# Patient Record
Sex: Male | Born: 1970 | Race: Black or African American | Hispanic: No | Marital: Single | Smoking: Current every day smoker
Health system: Southern US, Community
[De-identification: ages and names within clinical notes are randomized; demographics above are authoritative.]

## PROBLEM LIST (undated history)

## (undated) DIAGNOSIS — R569 Unspecified convulsions: Secondary | ICD-10-CM

## (undated) DIAGNOSIS — M1990 Unspecified osteoarthritis, unspecified site: Secondary | ICD-10-CM

## (undated) DIAGNOSIS — I10 Essential (primary) hypertension: Secondary | ICD-10-CM

## (undated) DIAGNOSIS — I639 Cerebral infarction, unspecified: Secondary | ICD-10-CM

---

## 2021-03-29 ENCOUNTER — Inpatient Hospital Stay (HOSPITAL_COMMUNITY): Payer: Medicaid Other

## 2021-03-29 ENCOUNTER — Other Ambulatory Visit: Payer: Self-pay

## 2021-03-29 ENCOUNTER — Inpatient Hospital Stay (HOSPITAL_BASED_OUTPATIENT_CLINIC_OR_DEPARTMENT_OTHER)
Admission: EM | Admit: 2021-03-29 | Discharge: 2021-04-02 | DRG: 064 | Disposition: A | Discharge status: Rehabilitation Facility | Payer: Medicaid Other | Attending: Internal Medicine | Admitting: Internal Medicine

## 2021-03-29 ENCOUNTER — Emergency Department (HOSPITAL_BASED_OUTPATIENT_CLINIC_OR_DEPARTMENT_OTHER): Payer: Medicaid Other

## 2021-03-29 ENCOUNTER — Encounter (HOSPITAL_BASED_OUTPATIENT_CLINIC_OR_DEPARTMENT_OTHER): Payer: Self-pay | Admitting: *Deleted

## 2021-03-29 DIAGNOSIS — I618 Other nontraumatic intracerebral hemorrhage: Secondary | ICD-10-CM | POA: Diagnosis present

## 2021-03-29 DIAGNOSIS — G40909 Epilepsy, unspecified, not intractable, without status epilepticus: Secondary | ICD-10-CM | POA: Diagnosis present

## 2021-03-29 DIAGNOSIS — I161 Hypertensive emergency: Secondary | ICD-10-CM

## 2021-03-29 DIAGNOSIS — I610 Nontraumatic intracerebral hemorrhage in hemisphere, subcortical: Secondary | ICD-10-CM

## 2021-03-29 DIAGNOSIS — R200 Anesthesia of skin: Secondary | ICD-10-CM | POA: Diagnosis present

## 2021-03-29 DIAGNOSIS — R7401 Elevation of levels of liver transaminase levels: Secondary | ICD-10-CM | POA: Diagnosis present

## 2021-03-29 DIAGNOSIS — I639 Cerebral infarction, unspecified: Secondary | ICD-10-CM | POA: Diagnosis present

## 2021-03-29 DIAGNOSIS — F17200 Nicotine dependence, unspecified, uncomplicated: Secondary | ICD-10-CM

## 2021-03-29 DIAGNOSIS — Z8673 Personal history of transient ischemic attack (TIA), and cerebral infarction without residual deficits: Secondary | ICD-10-CM

## 2021-03-29 DIAGNOSIS — F10239 Alcohol dependence with withdrawal, unspecified: Secondary | ICD-10-CM | POA: Diagnosis present

## 2021-03-29 DIAGNOSIS — I10 Essential (primary) hypertension: Secondary | ICD-10-CM | POA: Diagnosis present

## 2021-03-29 DIAGNOSIS — F17210 Nicotine dependence, cigarettes, uncomplicated: Secondary | ICD-10-CM | POA: Diagnosis present

## 2021-03-29 DIAGNOSIS — E785 Hyperlipidemia, unspecified: Secondary | ICD-10-CM | POA: Diagnosis present

## 2021-03-29 DIAGNOSIS — I69354 Hemiplegia and hemiparesis following cerebral infarction affecting left non-dominant side: Secondary | ICD-10-CM

## 2021-03-29 DIAGNOSIS — Z888 Allergy status to other drugs, medicaments and biological substances status: Secondary | ICD-10-CM

## 2021-03-29 DIAGNOSIS — F1010 Alcohol abuse, uncomplicated: Secondary | ICD-10-CM

## 2021-03-29 DIAGNOSIS — Z20822 Contact with and (suspected) exposure to covid-19: Secondary | ICD-10-CM | POA: Diagnosis present

## 2021-03-29 DIAGNOSIS — E119 Type 2 diabetes mellitus without complications: Secondary | ICD-10-CM | POA: Diagnosis present

## 2021-03-29 DIAGNOSIS — Z9114 Patient's other noncompliance with medication regimen: Secondary | ICD-10-CM | POA: Diagnosis not present

## 2021-03-29 DIAGNOSIS — E876 Hypokalemia: Secondary | ICD-10-CM | POA: Diagnosis present

## 2021-03-29 DIAGNOSIS — Z91013 Allergy to seafood: Secondary | ICD-10-CM

## 2021-03-29 DIAGNOSIS — G936 Cerebral edema: Secondary | ICD-10-CM | POA: Diagnosis present

## 2021-03-29 DIAGNOSIS — I6389 Other cerebral infarction: Secondary | ICD-10-CM

## 2021-03-29 DIAGNOSIS — Z9119 Patient's noncompliance with other medical treatment and regimen: Secondary | ICD-10-CM | POA: Diagnosis not present

## 2021-03-29 DIAGNOSIS — Z91199 Patient's noncompliance with other medical treatment and regimen due to unspecified reason: Secondary | ICD-10-CM

## 2021-03-29 HISTORY — DX: Unspecified convulsions: R56.9

## 2021-03-29 HISTORY — DX: Essential (primary) hypertension: I10

## 2021-03-29 HISTORY — DX: Cerebral infarction, unspecified: I63.9

## 2021-03-29 HISTORY — DX: Unspecified osteoarthritis, unspecified site: M19.90

## 2021-03-29 LAB — CBC
HCT: 41.7 % (ref 39.0–52.0)
HCT: 43 % (ref 39.0–52.0)
Hemoglobin: 14.2 g/dL (ref 13.0–17.0)
Hemoglobin: 14.7 g/dL (ref 13.0–17.0)
MCH: 28.9 pg (ref 26.0–34.0)
MCH: 29.1 pg (ref 26.0–34.0)
MCHC: 34.1 g/dL (ref 30.0–36.0)
MCHC: 34.2 g/dL (ref 30.0–36.0)
MCV: 84.9 fL (ref 80.0–100.0)
MCV: 85.1 fL (ref 80.0–100.0)
Platelets: 204 10*3/uL (ref 150–400)
Platelets: 212 10*3/uL (ref 150–400)
RBC: 4.91 MIL/uL (ref 4.22–5.81)
RBC: 5.05 MIL/uL (ref 4.22–5.81)
RDW: 13.6 % (ref 11.5–15.5)
RDW: 13.8 % (ref 11.5–15.5)
WBC: 5 10*3/uL (ref 4.0–10.5)
WBC: 5.8 10*3/uL (ref 4.0–10.5)
nRBC: 0 % (ref 0.0–0.2)
nRBC: 0 % (ref 0.0–0.2)

## 2021-03-29 LAB — URINALYSIS, ROUTINE W REFLEX MICROSCOPIC
Bilirubin Urine: NEGATIVE
Glucose, UA: NEGATIVE mg/dL
Hgb urine dipstick: NEGATIVE
Ketones, ur: NEGATIVE mg/dL
Leukocytes,Ua: NEGATIVE
Nitrite: NEGATIVE
Protein, ur: NEGATIVE mg/dL
Specific Gravity, Urine: 1.01 (ref 1.005–1.030)
pH: 7 (ref 5.0–8.0)

## 2021-03-29 LAB — COMPREHENSIVE METABOLIC PANEL
ALT: 44 U/L (ref 0–44)
ALT: 43 U/L (ref 0–44)
AST: 57 U/L — ABNORMAL HIGH (ref 15–41)
AST: 43 U/L — ABNORMAL HIGH (ref 15–41)
Albumin: 4.5 g/dL (ref 3.5–5.0)
Albumin: 4.2 g/dL (ref 3.5–5.0)
Alkaline Phosphatase: 60 U/L (ref 38–126)
Alkaline Phosphatase: 60 U/L (ref 38–126)
Anion gap: 12 (ref 5–15)
Anion gap: 15 (ref 5–15)
BUN: 13 mg/dL (ref 6–20)
BUN: 9 mg/dL (ref 6–20)
CO2: 28 mmol/L (ref 22–32)
CO2: 24 mmol/L (ref 22–32)
Calcium: 9 mg/dL (ref 8.9–10.3)
Calcium: 9.3 mg/dL (ref 8.9–10.3)
Chloride: 98 mmol/L (ref 98–111)
Chloride: 98 mmol/L (ref 98–111)
Creatinine, Ser: 1.09 mg/dL (ref 0.61–1.24)
Creatinine, Ser: 0.89 mg/dL (ref 0.61–1.24)
GFR, Estimated: >60 mL/min (ref >60)
GFR, Estimated: >60 mL/min (ref >60)
Glucose, Bld: 97 mg/dL (ref 70–99)
Glucose, Bld: 84 mg/dL (ref 70–99)
Potassium: 3.5 mmol/L (ref 3.5–5.1)
Potassium: 3.5 mmol/L (ref 3.5–5.1)
Sodium: 138 mmol/L (ref 135–145)
Sodium: 137 mmol/L (ref 135–145)
Total Bilirubin: 1.2 mg/dL (ref 0.3–1.2)
Total Bilirubin: 1.4 mg/dL — ABNORMAL HIGH (ref 0.3–1.2)
Total Protein: 7.6 g/dL (ref 6.5–8.1)
Total Protein: 7.1 g/dL (ref 6.5–8.1)

## 2021-03-29 LAB — ECHOCARDIOGRAM COMPLETE
AR max vel: 2.21 cm2
AV Area VTI: 1.96 cm2
AV Area mean vel: 2.1 cm2
AV Mean grad: 5 mmHg
AV Peak grad: 7 mmHg
Ao pk vel: 1.32 m/s
Area-P 1/2: 3.57 cm2
Height: 69 in
S' Lateral: 2 cm
Weight: 2320 oz

## 2021-03-29 LAB — RAPID URINE DRUG SCREEN, HOSP PERFORMED
Amphetamines: NOT DETECTED
Barbiturates: NOT DETECTED
Benzodiazepines: NOT DETECTED
Cocaine: NOT DETECTED
Opiates: NOT DETECTED
Tetrahydrocannabinol: NOT DETECTED

## 2021-03-29 LAB — DIFFERENTIAL
Abs Immature Granulocytes: 0 10*3/uL (ref 0.00–0.07)
Basophils Absolute: 0.1 10*3/uL (ref 0.0–0.1)
Basophils Relative: 1 %
Eosinophils Absolute: 0.1 10*3/uL (ref 0.0–0.5)
Eosinophils Relative: 1 %
Immature Granulocytes: 0 %
Lymphocytes Relative: 63 %
Lymphs Abs: 3.1 10*3/uL (ref 0.7–4.0)
Monocytes Absolute: 0.5 10*3/uL (ref 0.1–1.0)
Monocytes Relative: 10 %
Neutro Abs: 1.3 10*3/uL — ABNORMAL LOW (ref 1.7–7.7)
Neutrophils Relative %: 25 %

## 2021-03-29 LAB — PHOSPHORUS: Phosphorus: 2.6 mg/dL (ref 2.5–4.6)

## 2021-03-29 LAB — RESP PANEL BY RT-PCR (FLU A&B, COVID) ARPGX2
Influenza A by PCR: NEGATIVE
Influenza B by PCR: NEGATIVE
SARS Coronavirus 2 by RT PCR: NEGATIVE

## 2021-03-29 LAB — LIPID PANEL
Cholesterol: 248 mg/dL — ABNORMAL HIGH (ref 0–200)
HDL: 118 mg/dL (ref >40)
LDL Cholesterol: 77 mg/dL (ref 0–99)
Total CHOL/HDL Ratio: 2.1 RATIO
Triglycerides: 266 mg/dL — ABNORMAL HIGH (ref <150)
VLDL: 53 mg/dL — ABNORMAL HIGH (ref 0–40)

## 2021-03-29 LAB — MAGNESIUM: Magnesium: 1.9 mg/dL (ref 1.7–2.4)

## 2021-03-29 LAB — SURGICAL PCR SCREEN
MRSA, PCR: NEGATIVE
Staphylococcus aureus: NEGATIVE

## 2021-03-29 LAB — ETHANOL: Alcohol, Ethyl (B): <10 mg/dL (ref <10)

## 2021-03-29 LAB — PROTIME-INR
INR: 1 (ref 0.8–1.2)
Prothrombin Time: 12.8 seconds (ref 11.4–15.2)

## 2021-03-29 LAB — APTT: aPTT: 30 seconds (ref 24–36)

## 2021-03-29 LAB — HIV ANTIBODY (ROUTINE TESTING W REFLEX): HIV Screen 4th Generation wRfx: NONREACTIVE

## 2021-03-29 MED ORDER — PANTOPRAZOLE SODIUM 40 MG PO TBEC
40.0000 mg | DELAYED_RELEASE_TABLET | Freq: Every day | ORAL | Status: DC
  Administered 2021-03-29 – 2021-04-02 (×5): 40 mg via ORAL
  Filled 2021-03-29 (×5): qty 1

## 2021-03-29 MED ORDER — THIAMINE HCL 100 MG PO TABS
100.0000 mg | ORAL_TABLET | Freq: Every day | ORAL | Status: DC
  Administered 2021-03-29 – 2021-04-02 (×5): 100 mg via ORAL
  Filled 2021-03-29 (×5): qty 1

## 2021-03-29 MED ORDER — THIAMINE HCL 100 MG/ML IJ SOLN
100.0000 mg | Freq: Every day | INTRAMUSCULAR | Status: DC
  Filled 2021-03-29: qty 2

## 2021-03-29 MED ORDER — LORAZEPAM 1 MG PO TABS
0.0000 mg | ORAL_TABLET | Freq: Three times a day (TID) | ORAL | Status: DC
  Administered 2021-03-31: 1 mg via ORAL
  Filled 2021-03-29 (×2): qty 1

## 2021-03-29 MED ORDER — ACETAMINOPHEN 160 MG/5ML PO SOLN: 650.0000 mg | ORAL | Status: DC | PRN

## 2021-03-29 MED ORDER — CHLORHEXIDINE GLUCONATE CLOTH 2 % EX PADS
6.0000 | MEDICATED_PAD | Freq: Every day | CUTANEOUS | Status: DC
  Administered 2021-03-29 – 2021-04-02 (×5): 6 via TOPICAL

## 2021-03-29 MED ORDER — SENNOSIDES-DOCUSATE SODIUM 8.6-50 MG PO TABS
1.0000 | ORAL_TABLET | Freq: Two times a day (BID) | ORAL | Status: DC
  Administered 2021-03-29 – 2021-04-02 (×9): 1 via ORAL
  Filled 2021-03-29 (×9): qty 1

## 2021-03-29 MED ORDER — LORAZEPAM 1 MG PO TABS
0.0000 mg | ORAL_TABLET | ORAL | Status: DC
  Administered 2021-03-30 (×3): 1 mg via ORAL
  Filled 2021-03-29 (×3): qty 1

## 2021-03-29 MED ORDER — FOLIC ACID 1 MG PO TABS
1.0000 mg | ORAL_TABLET | Freq: Every day | ORAL | Status: DC
  Administered 2021-03-29 – 2021-04-02 (×5): 1 mg via ORAL
  Filled 2021-03-29 (×5): qty 1

## 2021-03-29 MED ORDER — LORAZEPAM 1 MG PO TABS: 1.0000 mg | ORAL_TABLET | ORAL | Status: DC | PRN

## 2021-03-29 MED ORDER — LORAZEPAM 2 MG/ML IJ SOLN: 1.0000 mg | INTRAMUSCULAR | Status: DC | PRN

## 2021-03-29 MED ORDER — LABETALOL HCL 200 MG PO TABS
200.0000 mg | ORAL_TABLET | Freq: Two times a day (BID) | ORAL | Status: DC
  Administered 2021-03-29 – 2021-04-02 (×9): 200 mg via ORAL
  Filled 2021-03-29: qty 1
  Filled 2021-03-29: qty 2
  Filled 2021-03-29 (×7): qty 1

## 2021-03-29 MED ORDER — CLEVIDIPINE BUTYRATE 0.5 MG/ML IV EMUL
0.0000 mg/h | INTRAVENOUS | Status: DC
  Administered 2021-03-29: 14 mg/h via INTRAVENOUS
  Administered 2021-03-29: 1 mg/h via INTRAVENOUS
  Administered 2021-03-29: 10 mg/h via INTRAVENOUS
  Administered 2021-03-29: 8 mg/h via INTRAVENOUS
  Filled 2021-03-29 (×4): qty 100

## 2021-03-29 MED ORDER — GADOBUTROL 1 MMOL/ML IV SOLN
6.5000 mL | Freq: Once | INTRAVENOUS | Status: AC | PRN
  Administered 2021-03-29: 6.5 mL via INTRAVENOUS

## 2021-03-29 MED ORDER — ACETAMINOPHEN 325 MG PO TABS: 650.0000 mg | ORAL_TABLET | ORAL | Status: DC | PRN

## 2021-03-29 MED ORDER — LISINOPRIL 20 MG PO TABS: 20.0000 mg | ORAL_TABLET | Freq: Every day | ORAL | Status: DC

## 2021-03-29 MED ORDER — STROKE: EARLY STAGES OF RECOVERY BOOK
Freq: Once | Status: AC
  Filled 2021-03-29: qty 1

## 2021-03-29 MED ORDER — LABETALOL HCL 5 MG/ML IV SOLN: 5.0000 mg | INTRAVENOUS | Status: DC | PRN

## 2021-03-29 MED ORDER — PANTOPRAZOLE SODIUM 40 MG IV SOLR: 40.0000 mg | Freq: Every day | INTRAVENOUS | Status: DC

## 2021-03-29 MED ORDER — AMLODIPINE BESYLATE 10 MG PO TABS
10.0000 mg | ORAL_TABLET | Freq: Every day | ORAL | Status: DC
  Administered 2021-03-29 – 2021-04-02 (×5): 10 mg via ORAL
  Filled 2021-03-29 (×5): qty 1

## 2021-03-29 MED ORDER — ADULT MULTIVITAMIN W/MINERALS CH
1.0000 | ORAL_TABLET | Freq: Every day | ORAL | Status: DC
  Administered 2021-03-29 – 2021-04-02 (×5): 1 via ORAL
  Filled 2021-03-29 (×5): qty 1

## 2021-03-29 MED ORDER — ACETAMINOPHEN 650 MG RE SUPP: 650.0000 mg | RECTAL | Status: DC | PRN

## 2021-03-29 MED ORDER — IOHEXOL 350 MG/ML SOLN
100.0000 mL | Freq: Once | INTRAVENOUS | Status: AC | PRN
  Administered 2021-03-29: 100 mL via INTRAVENOUS

## 2021-03-29 NOTE — Progress Notes (Signed)
PT Cancellation Note  Patient Details Name: Ricky Gonzalez MRN: [id number] DOB: [birth year]   Cancelled Treatment:    Reason Eval/Treat Not Completed: Patient not medically ready Pt considered on active bedrest for 24 hours due to Acute intraparenchymal hemorrhage centered at the left thalamus/internal capsule measures 2.1 x 1.5 x 2.8 cm (estimated volume 4.5 cc) per therapeutic  guidelines protocol / stroke services.  Lillia Pauls, PT, DPT Acute Rehabilitation Services Pager [phone] Office [phone]    Norval Morton 03/29/2021, 8:11 AM

## 2021-03-29 NOTE — ED Notes (Signed)
Patient transported to CT with RN 

## 2021-03-29 NOTE — Evaluation (Signed)
Speech Language Pathology Evaluation Patient Details Name: Ricky Gonzalez MRN: [id number] DOB: [birth year] Today's Date: 03/29/2021 Time: 2992-4268 SLP Time Calculation (min) (ACUTE ONLY): 25 min  Problem List:  Patient Active Problem List   Diagnosis Date Noted   Stroke (cerebrum) (HCC) 03/29/2021   Primary hypertension    Thalamic hemorrhage (HCC)    Hypertensive emergency    Smoker    Alcohol abuse    History of stroke    Past Medical History:  Past Medical History:  Diagnosis Date   Arthritis    Hypertension    Seizures (HCC)    Stroke [facility])    Past Surgical History:  Past Surgical History:  Procedure Laterality Date   CERVICAL SPINE SURGERY     HPI:  50 y.o. male admitted with right-sided numbness.  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 Pt lives with wife and two adult sons.  Since stroke PTA did yard work, drove short distances, helps with household chores.   Assessment / Plan / Recommendation Clinical Impression  Pt presents with normal expressive language and comprehension. He follows multistep commands.  He conveys ideas with no difficulty with word retrieval and good syntax. Speech is clear, without dysarthria, and fluent. Pt is good historian - he demonstrates good awareness, attention, and short term recall. Verbal problem solving is [facility]. There are no needs identified - SLP will sign off. No f/u needed.    SLP Assessment  SLP Recommendation/Assessment: Patient does not need any further Speech Lanaguage Pathology Services SLP Visit Diagnosis: Cognitive communication deficit (R41.841)    Follow Up Recommendations  None    Frequency and Duration   N/a        SLP Evaluation Cognition  Overall Cognitive Status: Within Functional Limits for tasks assessed Arousal/Alertness:  Awake/alert Orientation Level: Oriented X4 Attention: Selective Selective Attention: Appears intact Memory: Appears intact Awareness: Appears intact Problem Solving: Appears intact Executive Function: Reasoning Reasoning: Appears intact Safety/Judgment: Appears intact       Comprehension  Auditory Comprehension Overall Auditory Comprehension: Appears within functional limits for tasks assessed Yes/No Questions: Within Functional Limits Commands: Within Functional Limits Visual Recognition/Discrimination Discrimination: Within Function Limits Reading Comprehension Reading Status: Within funtional limits    Expression Expression Primary Mode of Expression: Verbal Verbal Expression Overall Verbal Expression: Appears within functional limits for tasks assessed   Oral / Motor  Oral Motor/Sensory Function Overall Oral Motor/Sensory Function: Within functional limits Motor Speech Overall Motor Speech: Appears within functional limits for tasks assessed   GO                    Blenda Mounts Laurice 03/29/2021, 2:52 PM  Alexanderia Gorby L. Samson Frederic, MA CCC/SLP Acute Rehabilitation Services Office number [phone] Pager [phone]

## 2021-03-29 NOTE — ED Notes (Signed)
Patient alert and answering questions appropriately.  Unable to feel anything on the right side of his body.  Wife remains at the bedside.  Encouraged to call for assistance as needed.

## 2021-03-29 NOTE — Progress Notes (Signed)
OT Cancellation Note  Patient Details Name: Ricky Gonzalez MRN: [id number] DOB: [birth year]   Cancelled Treatment:    Reason Eval/Treat Not Completed: Patient not medically ready Pt considered on active bedrest due to Acute intraparenchymal hemorrhage centered at the left thalamus/internal capsule measures 2.1 x 1.5 x 2.8 cm (estimated volume 4.5 cc) per therapeutic  guidelines protocol / stroke services.   Wynona Neat, OTR/L  Acute Rehabilitation Services Pager: [phone] Office: [phone] .  03/29/2021, 8:00 AM

## 2021-03-29 NOTE — ED Notes (Signed)
[facility] at bedside to transfer patient to [facility]. No acute distress noted. Wife at bedside.

## 2021-03-29 NOTE — H&P (Signed)
Neurology H&P  CC: Right-sided numbness  History is obtained from: Patient  HPI: Ricky Gonzalez is a 50 y.o. male with a history of hypertension, stroke with left-sided weakness, seizures who presents with right-sided numbness that started yesterday at 4 PM.  He has a chronic left hemiparesis from previous stroke, but the right-sided numbness was new.  Due to this symptom, he sought care at [facility] where a CT was performed showing a intraparenchymal hematoma.  He was hypertensive and started on clevidipine, and transferred to [facility] for further care.   LKW: 4 PM tpa given?: No, ICH IR Thrombectomy? No, ICH Modified Rankin Scale: 2-Slight disability-UNABLE to perform all activities but does not need assistance    ROS: A complete ROS was performed and is negative except as noted in the HPI.  Past Medical History:  Diagnosis Date   Arthritis    Hypertension    Seizures (HCC)    Stroke (HCC)      Family history: No history of stroke   Social History:   Prior to Admission medications   Not on File     Exam: Current vital signs: BP 130/87   Pulse 86   Temp 98.9 F (37.2 C) (Oral)   Resp 17   Ht 5\' 9"  (1.753 m)   Wt 65.8 kg   SpO2 96%   BMI 21.41 kg/m    Physical Exam  Constitutional: Appears well-developed and well-nourished.  Psych: Affect appropriate to situation Eyes: No scleral injection HENT: No OP obstrucion Head: Normocephalic.  Cardiovascular: Normal rate and regular rhythm.  Respiratory: Effort normal and breath sounds normal to anterior ascultation GI: Soft.  No distension. There is no tenderness.  Skin: WDI  Neuro: Mental Status: Patient is awake, alert, oriented to person, place, month, year, and situation. Patient is able to give a clear and coherent history. No signs of aphasia or neglect Cranial Nerves: II: Visual Fields are full.  Right pupil is about 2 mm larger than the left, but both are reactive III,IV, VI: EOMI without  ptosis or diplopia.  V: Facial sensation is symmetric to temperature VII: Facial movement is symmetric.  VIII: hearing is intact to voice X: Uvula is midline and palate elevates symmetrically XI: Shoulder shrug is symmetric. XII: tongue is midline without atrophy or fasciculations.  Motor: Tone is normal. Bulk is normal. 5/5 strength was present on the right, he has 4+/5 strength of the left arm and leg with decreased fine motor out of proportion. Sensory: Sensation is diminished on the right Cerebellar: FNF consistent with weakness on the left, intact on the right   I have reviewed labs in epic and the pertinent results are: CMP-unremarkable other than mildly elevated AST which I suspect is due to EtOH INR 1.0 PTT-normal CBC-normal  I have reviewed the images obtained: CT/CTA-likely hypertensive subcortical bleed on the left with some surrounding cerebral edema  Primary Diagnosis:  Nontraumatic intracerebral hemorrhage in hemisphere, subcortical  Secondary Diagnosis: Cerebral edema, Essential (primary) hypertension, and Hypertension Emergency (SBP > 180 or DBP > 120 & end organ damage)   Impression: 50 year old male with likely hypertensive hemorrhage.  He also endorses a history of significant drinking (a pint a day) and therefore I will start him on CIWA.  Plan: 1) Admit to ICU 2) no antiplatelets or anticoagulants 3) blood pressure control with goal systolic 120 - 140 4) Frequent neuro checks 5) If symptoms worsen or there is decreased mental status, repeat stat head CT 6)  PT,OT,ST    This patient is critically ill and at significant risk of neurological worsening, death and care requires constant monitoring of vital signs, hemodynamics,respiratory and cardiac monitoring, neurological assessment, discussion with family, other specialists and medical decision making of high complexity. I spent 60 minutes of neurocritical care time  in the care of  this patient. This was  time spent independent of any time provided by nurse practitioner or PA.  Ritta Slot, MD [facility] [phone]  If 7pm- 7am, please page neurology on call as listed in AMION.

## 2021-03-29 NOTE — ED Notes (Signed)
Report given to [employer] at [facility].

## 2021-03-29 NOTE — ED Triage Notes (Signed)
Pt brought in by family. Reports right leg numb and weak since 4pm. Pt assisted from car by ED staff and taken to room 5. Dr. Nicanor Alcon in to evaluate pt upon arrival

## 2021-03-29 NOTE — Progress Notes (Addendum)
STROKE TEAM PROGRESS NOTE   Interval History   No acute events overnight, patient is resting in bed with no family at bedside.   Amlodipine 10 mg was initiated for blood pressure control. Patient states that he was placed on Lisinopril on an outpatient basis however developed angioedema due to this.  Will try to wean Clevidipine gtt today.  Pertinent Lab Work and Imaging    03/29/21 CT Head WO IV Contrast 4.5 mL acute intraparenchymal left thalamocapsular intraparenchymal hemorrhage. Mild surrounding edema without significant regional mass effect. No intraventricular extension. A hypertensive etiology is suspected.  03/29/21 CT Angio Head and Neck W WO IV Contrast Negative CTA of the head and neck. No large vessel occlusion. No high-grade or correctable stenosis identified. No aneurysm or vascular abnormality seen underlying the left cerebral hemorrhage.  03/29/21 MRI Brain W WO IV Contrast Results pending   03/29/21 Echocardiogram Complete  Results pending   Physical Examination   Constitutional: Calm, appropriate for condition  Cardiovascular: Normal RR Respiratory: No increased WOB   Mental status: AAOx4 Speech: Fluent with intact naming and repetition  Cranial nerves: EOMI, VFF, Face symmetric, Tongue midline  Motor: Normal bulk and tone. Pronator drift to RUE.   Dlt Bic Tri FgS Grp HF  KnF KnE PIF DoF  R 5 5 5 5 5 5 5 5 5 5   L 5 5 5 5 4 5 5 5 5 5   Sensory: Does not feel light touch in the right arm and right leg  Coordination: FNF + HTS intact  Gait: Deferred given right sided sensory deficit   Assessment and Plan   Ricky Gonzalez is a 50 y.o. 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 He did not receive IVTPA given bleed and was not a candidate for mechanical thrombectomy given lack of LVO.   NEURO #L Thalamic IPH  Patient presented with the symptoms described above,  found to have a L Thalamic IPH. Stroke work up is ongoing at this time. CTA Head and Neck was negative for vascular anomalies or aneurysm that could have caused his bleed. UDS negative. MRI Brain W WO contrast pending to further evaluate for vascular abnormalities. Echo also pending. Stroke labs with LDL 53 and A1C pending. Suspect bleed is in the setting of HTN given location + initial SBP trends in the 160-200 range.  - No antiplatelets  - SBP goal < 140  - Follow up remaining work up- echo and MRI Brain  -At discharge will place ambulatory referral to neurology for stroke follow up   #Substance Abuse  He smokes and drinks a pint of beer everyday at home. Smoking and drinking cessation education provided. CIWA protocol in place for alcohol withdrawal.  - Continue CIWA   CARDS #Hypertension He has a history of HTN and states that on an outpatient basis he was prescribed Lisinopril but stopped taking it as it caused angioedema. Does not appear to have been taking any blood pressure medications at home. Currently he is on a Clevidipine gtt and SBP is trending normotensive.  - Amlodipine 10 mg QD started 03/29/21 - Wean Clevidipine gtt  - SBP goal < 140   #Hyperlipidemia From a stroke prevention stand point, the LDL goal is < 70. LDL is at goal at 53. No need for statin therapy.   RESP Oxygenating well on RA. No acute respiratory issues at this time  RENAL Creatinine and lytes  are WNL  - Monitor lytes with BMP supplement as necessary   GI  #Stroke Dysphagia Screening  Passed bedside swallow evaluation for a regular diet   ENDO #DM II  Hemoglobin A1C pending   HEME Hemoglobin hematocrit and platelet count stable   ID Afebrile. No infectious processes at this time.   Hospital day # 0  Stark Jock, NP  [facility] Nurse Practitioner Patient seen and discussed with attending physician Dr. Roda Shutters    ATTENDING NOTE: I reviewed above note and agree with the  assessment and plan. Pt was seen and examined.   50 year old male with history of hypertension, seizure disorder, smoker, alcohol abuse, stroke in 2017 with residual left-sided mild weakness admitted for right-sided numbness.  CT showed left thalamic/CR ICH.  CTA head and neck no AVM or aneurysm.  MRI with and without contrast no underlying malignancy and hematoma stable.  2D echo pending, UDS negative.  LDL 77, A1c pending.  Creatinine 1.09  On exam, RN at bedside, patient awake, alert, eyes open, orientated to age, place, time. No aphasia, fluent language, following all simple commands. Able to name and repeat. No gaze palsy, tracking bilaterally, visual field full, PERRL. No facial droop. Tongue midline. BUEs 5/5 and BLEs 5/5, no drift. Sensation loss at right face, arm and leg. B/l FTN and HTS dysmetria but right more prominent than left. Gait not tested.   Etiology for patient ICH likely due to hypertensive emergency, in the setting of long-term smoking and alcohol abuse.  Smoking cessation and alcohol restriction education provided.  On CIWA protocol.  MRI showed stable hematoma, will relax BP goal to less than 160.  Add p.o. meds including amlodipine and labetalol.  Patient has allergy to lisinopril in the past.  Taper off Cleviprex as able.  PT/OT pending.  For detailed assessment and plan, please refer to above as I have made changes wherever appropriate.   Marvel Plan, MD PhD Stroke Neurology 03/29/2021 12:44 PM  This patient is critically ill due to ICH, hypertensive emergency, history of stroke and at significant risk of neurological worsening, death form hematoma expansion, cerebral edema, brain herniation, hypertensive encephalopathy, seizure. This patient's care requires constant monitoring of vital signs, hemodynamics, respiratory and cardiac monitoring, review of multiple databases, neurological assessment, discussion with family, other specialists and medical decision making of high  complexity. I spent 35 minutes of neurocritical care time in the care of this patient.    To contact Stroke Continuity provider, please refer to [email or web address]. After hours, contact General Neurology

## 2021-03-29 NOTE — ED Provider Notes (Signed)
[facility] EMERGENCY DEPARTMENT Provider Note   CSN: [id number] Arrival date & time: 03/29/21  0020     History Chief Complaint  Patient presents with   Numbness    Ricky Gonzalez is a 50 y.o. male.   Cerebrovascular Accident This is a new problem. The current episode started 6 to 12 hours ago (8.5). The problem occurs constantly. The problem has not changed since onset.Pertinent negatives include no chest pain and no abdominal pain. Associated symptoms comments: Heard a pop at 4 pm . Nothing aggravates the symptoms. Nothing relieves the symptoms. He has tried nothing for the symptoms. The treatment provided no relief.  Patient with HYN who heard a pop at 4 pm and had right sided numbness and weakness and then went to sleep and symptoms persisted and patient came in.    Past Medical History:  Diagnosis Date   Arthritis    Hypertension    Seizures (HCC)    Stroke (HCC)     There are no problems to display for this patient.   Past Surgical History:  Procedure Laterality Date   CERVICAL SPINE SURGERY         History reviewed. No pertinent family history.  Social History   Tobacco Use   Smoking status: Every Day    Pack years: 0.00    Types: Cigarettes   Smokeless tobacco: Never  Vaping Use   Vaping Use: Never used  Substance Use Topics   Alcohol use: Yes    Comment: 2/day   Drug use: Never    Home Medications Prior to Admission medications   Not on File    Allergies    Fish allergy  Review of Systems   Review of Systems  Constitutional:  Negative for fever.  HENT:  Negative for facial swelling.   Eyes:  Negative for redness.  Cardiovascular:  Negative for chest pain.  Gastrointestinal:  Negative for abdominal pain.  Genitourinary:  Negative for difficulty urinating.  Musculoskeletal:  Negative for neck stiffness.  Skin:  Negative for rash.  Neurological:  Positive for weakness and numbness. Negative for facial asymmetry.   Psychiatric/Behavioral:  Negative for agitation.   All other systems reviewed and are negative.  Physical Exam Updated Vital Signs BP (!) 166/115   Pulse 76   Temp 98.9 F (37.2 C) (Oral)   Resp 16   Ht 5\' 9"  (1.753 m)   Wt 65.8 kg   SpO2 99%   BMI 21.41 kg/m   Physical Exam Vitals and nursing note reviewed.  Constitutional:      General: He is not in acute distress.    Appearance: Normal appearance.  HENT:     Head: Normocephalic and atraumatic.     Nose: Nose normal.     Mouth/Throat:     Mouth: Mucous membranes are moist.  Eyes:     Extraocular Movements: Extraocular movements intact.     Conjunctiva/sclera: Conjunctivae normal.     Pupils: Pupils are equal, round, and reactive to light.  Cardiovascular:     Rate and Rhythm: Normal rate and regular rhythm.     Pulses: Normal pulses.     Heart sounds: Normal heart sounds.  Pulmonary:     Effort: Pulmonary effort is normal.     Breath sounds: Normal breath sounds.  Abdominal:     General: Abdomen is flat. Bowel sounds are normal.     Palpations: Abdomen is soft.     Tenderness: There is no abdominal tenderness. There is  no guarding.  Musculoskeletal:        General: Normal range of motion.     Cervical back: Normal range of motion and neck supple.  Skin:    General: Skin is warm and dry.     Capillary Refill: Capillary refill takes less than 2 seconds.  Neurological:     Mental Status: He is alert and oriented to person, place, and time.     Coordination: Coordination normal.     Comments: Garbled speech Right arm and leg weakness 4+   Psychiatric:        Mood and Affect: Mood normal.        Behavior: Behavior normal.    ED Results / Procedures / Treatments   Labs (all labs ordered are listed, but only abnormal results are displayed) Results for orders placed or performed during the hospital encounter of 03/29/21  Resp Panel by RT-PCR (Flu A&B, Covid) Nasopharyngeal Swab   Specimen: Nasopharyngeal Swab;  Nasopharyngeal(NP) swabs in vial transport medium  Result Value Ref Range   SARS Coronavirus 2 by RT PCR NEGATIVE NEGATIVE   Influenza A by PCR NEGATIVE NEGATIVE   Influenza B by PCR NEGATIVE NEGATIVE  Protime-INR  Result Value Ref Range   Prothrombin Time 12.8 11.4 - 15.2 seconds   INR 1.0 0.8 - 1.2  APTT  Result Value Ref Range   aPTT 30 24 - 36 seconds  CBC  Result Value Ref Range   WBC 5.0 4.0 - 10.5 K/uL   RBC 4.91 4.22 - 5.81 MIL/uL   Hemoglobin 14.2 13.0 - 17.0 g/dL   HCT 81.141.7 91.439.0 - 78.252.0 %   MCV 84.9 80.0 - 100.0 fL   MCH 28.9 26.0 - 34.0 pg   MCHC 34.1 30.0 - 36.0 g/dL   RDW 95.613.8 21.311.5 - 08.615.5 %   Platelets 212 150 - 400 K/uL   nRBC 0.0 0.0 - 0.2 %  Differential  Result Value Ref Range   Neutrophils Relative % 25 %   Neutro Abs 1.3 (L) 1.7 - 7.7 K/uL   Lymphocytes Relative 63 %   Lymphs Abs 3.1 0.7 - 4.0 K/uL   Monocytes Relative 10 %   Monocytes Absolute 0.5 0.1 - 1.0 K/uL   Eosinophils Relative 1 %   Eosinophils Absolute 0.1 0.0 - 0.5 K/uL   Basophils Relative 1 %   Basophils Absolute 0.1 0.0 - 0.1 K/uL   Immature Granulocytes 0 %   Abs Immature Granulocytes 0.00 0.00 - 0.07 K/uL  Comprehensive metabolic panel  Result Value Ref Range   Sodium 138 135 - 145 mmol/L   Potassium 3.5 3.5 - 5.1 mmol/L   Chloride 98 98 - 111 mmol/L   CO2 28 22 - 32 mmol/L   Glucose, Bld 97 70 - 99 mg/dL   BUN 13 6 - 20 mg/dL   Creatinine, Ser 5.781.09 0.61 - 1.24 mg/dL   Calcium 9.0 8.9 - 46.910.3 mg/dL   Total Protein 7.6 6.5 - 8.1 g/dL   Albumin 4.5 3.5 - 5.0 g/dL   AST 57 (H) 15 - 41 U/L   ALT 44 0 - 44 U/L   Alkaline Phosphatase 60 38 - 126 U/L   Total Bilirubin 1.2 0.3 - 1.2 mg/dL   GFR, Estimated >62>60 >95>60 mL/min   Anion gap 12 5 - 15   CT HEAD CODE STROKE WO CONTRAST  Result Date: 03/29/2021 CLINICAL DATA:  Code stroke. Initial evaluation for acute right-sided numbness and weakness. EXAM: CT HEAD WITHOUT  CONTRAST TECHNIQUE: Contiguous axial images were obtained from the base  of the skull through the vertex without intravenous contrast. COMPARISON:  None. FINDINGS: Brain: Acute intraparenchymal hemorrhage centered at the left thalamus/internal capsule measures 2.1 x 1.5 x 2.8 cm (estimated volume 4.5 cc). Mild surrounding edema without significant regional mass effect. No visible intraventricular extension. No other acute intracranial hemorrhage. No other acute large vessel territory infarct. No mass lesion or midline shift. No hydrocephalus or extra-axial fluid collection. Vascular: No hyperdense vessel. Scattered vascular calcifications noted within the carotid siphons. Skull: Small lipoma noted at the right frontal scalp. Calvarium intact. Sinuses/Orbits: Globes orbital soft tissues demonstrate no acute finding. Remote posttraumatic defect at the right lamina papyracea. Paranasal sinuses are clear. Small chronic right mastoid effusion noted. Other: None. ASPECTS [facility] Stroke Program Early CT Score) Does not apply, acute intracranial hemorrhage. IMPRESSION: 4.5 mL acute intraparenchymal left thalamocapsular intraparenchymal hemorrhage. Mild surrounding edema without significant regional mass effect. No intraventricular extension. A hypertensive etiology is suspected. Critical Value/emergent results were called by telephone at the time of interpretation on 03/29/2021 at 12:52 am to provider [facility] , who verbally acknowledged these results. Electronically Signed   By: Rise Mu M.D.   On: 03/29/2021 00:54     Radiology CT HEAD CODE STROKE WO CONTRAST  Result Date: 03/29/2021 CLINICAL DATA:  Code stroke. Initial evaluation for acute right-sided numbness and weakness. EXAM: CT HEAD WITHOUT CONTRAST TECHNIQUE: Contiguous axial images were obtained from the base of the skull through the vertex without intravenous contrast. COMPARISON:  None. FINDINGS: Brain: Acute intraparenchymal hemorrhage centered at the left thalamus/internal capsule measures 2.1 x 1.5 x 2.8 cm  (estimated volume 4.5 cc). Mild surrounding edema without significant regional mass effect. No visible intraventricular extension. No other acute intracranial hemorrhage. No other acute large vessel territory infarct. No mass lesion or midline shift. No hydrocephalus or extra-axial fluid collection. Vascular: No hyperdense vessel. Scattered vascular calcifications noted within the carotid siphons. Skull: Small lipoma noted at the right frontal scalp. Calvarium intact. Sinuses/Orbits: Globes orbital soft tissues demonstrate no acute finding. Remote posttraumatic defect at the right lamina papyracea. Paranasal sinuses are clear. Small chronic right mastoid effusion noted. Other: None. ASPECTS [facility] Stroke Program Early CT Score) Does not apply, acute intracranial hemorrhage. IMPRESSION: 4.5 mL acute intraparenchymal left thalamocapsular intraparenchymal hemorrhage. Mild surrounding edema without significant regional mass effect. No intraventricular extension. A hypertensive etiology is suspected. Critical Value/emergent results were called by telephone at the time of interpretation on 03/29/2021 at 12:52 am to provider [facility] , who verbally acknowledged these results. Electronically Signed   By: Rise Mu M.D.   On: 03/29/2021 00:54    Procedures Procedures   Medications Ordered in ED Medications  clevidipine (CLEVIPREX) infusion 0.5 mg/mL (has no administration in time range)  iohexol (OMNIPAQUE) 350 MG/ML injection 100 mL (has no administration in time range)    ED Course  I have reviewed the triage vital signs and the nursing notes.  Pertinent labs & imaging results that were available during my care of the patient were reviewed by me and considered in my medical decision making (see chart for details).   110 case d/w Dr. Jake Samples, no NSG intervention but get CTA  120 case d/w Dr. Amada Jupiter who will admit the patient   MDM Reviewed: previous chart, nursing note and  vitals Interpretation: labs, ECG and CT scan (Normal labs, Bleed in the thalamus by me on CT) Total time providing critical care: 75-105 minutes (Cleviprex  drip initiated by me in the ED). This excludes time spent performing separately reportable procedures and services. Consults: neurology (neurosurgery)   03/29/2021 CRITICAL CARE Performed by: Warda Mcqueary K Harry Shuck-Rasch Total critical care time: 90 minutes Critical care time was exclusive of separately billable procedures and treating other patients. Critical care was necessary to treat or prevent imminent or life-threatening deterioration. Critical care was time spent personally by me on the following activities: development of treatment plan with patient and/or surrogate as well as nursing, discussions with consultants, evaluation of patient's response to treatment, examination of patient, obtaining history from patient or surrogate, ordering and performing treatments and interventions, ordering and review of laboratory studies, ordering and review of radiographic studies, pulse oximetry and re-evaluation of patient's condition.   Final Clinical Impression(s) / ED Diagnoses Final diagnoses:  Cerebrovascular accident (CVA), unspecified mechanism (HCC)   Admit to neurology   Rx / DC Orders ED Discharge Orders     None        Min Tunnell, MD 03/29/21 0140

## 2021-03-30 DIAGNOSIS — F17200 Nicotine dependence, unspecified, uncomplicated: Secondary | ICD-10-CM

## 2021-03-30 DIAGNOSIS — I639 Cerebral infarction, unspecified: Secondary | ICD-10-CM

## 2021-03-30 DIAGNOSIS — I10 Essential (primary) hypertension: Secondary | ICD-10-CM

## 2021-03-30 DIAGNOSIS — Z9119 Patient's noncompliance with other medical treatment and regimen: Secondary | ICD-10-CM

## 2021-03-30 DIAGNOSIS — F1010 Alcohol abuse, uncomplicated: Secondary | ICD-10-CM

## 2021-03-30 LAB — HEMOGLOBIN A1C
Hgb A1c MFr Bld: 4.7 % — ABNORMAL LOW (ref 4.8–5.6)
Mean Plasma Glucose: 88 mg/dL

## 2021-03-30 MED ORDER — POTASSIUM CHLORIDE 20 MEQ PO PACK
40.0000 meq | PACK | Freq: Once | ORAL | Status: AC
  Administered 2021-03-30: 40 meq via ORAL
  Filled 2021-03-30: qty 2

## 2021-03-30 MED ORDER — ATORVASTATIN CALCIUM 10 MG PO TABS
20.0000 mg | ORAL_TABLET | Freq: Every day | ORAL | Status: DC
  Administered 2021-03-30 – 2021-04-02 (×4): 20 mg via ORAL
  Filled 2021-03-30 (×4): qty 2

## 2021-03-30 MED ORDER — MAGNESIUM SULFATE 2 GM/50ML IV SOLN
2.0000 g | Freq: Once | INTRAVENOUS | Status: AC
  Administered 2021-03-30: 2 g via INTRAVENOUS
  Filled 2021-03-30: qty 50

## 2021-03-30 NOTE — Evaluation (Signed)
Physical Therapy Evaluation Patient Details Name: Ricky Gonzalez MRN: [id number] DOB: 05-17-1971 Today's Date: 03/30/2021   History of Present Illness  49 y.o male presenting to ED 6/27 with R sided numbness and weakness. 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   Clinical Impression  Pt admitted with above diagnosis. PTA pt lived at home with his significant other and 2 adult sons. He was independent with mobility and ADLs. On eval, he required min guard assist bed mobility and mod assist sit to stand. Unable to assess gait today due to +orthostatics. Pt will need +2 assist to safely progress gait. Pt currently with functional limitations due to the deficits listed below (see PT Problem List). Pt will benefit from skilled PT to increase their independence and safety with mobility to allow discharge to the venue listed below.                                BP          HR Supine             138/94        84 Sitting              122/91        86 Standing          106/67       102     Follow Up Recommendations CIR    Equipment Recommendations  Other (comment) (TBD)    Recommendations for Other Services Rehab consult     Precautions / Restrictions Precautions Precautions: Fall;Other (comment) Precaution Comments: +orthostatics Restrictions Weight Bearing Restrictions: No      Mobility  Bed Mobility Overal bed mobility: Needs Assistance Bed Mobility: Supine to Sit;Sit to Supine     Supine to sit: Min guard;HOB elevated Sit to supine: Min guard   General bed mobility comments: ataxic, decreased coordination with transition to EOB requiring close min guard assist, cues for sequencing and safety    Transfers Overall transfer level: Needs assistance Equipment used: None Transfers: Sit to/from Stand Sit  to Stand: Mod assist         General transfer comment: assist to power up and stabilize balance, R knee buckling noted. R lateral lean  Ambulation/Gait             General Gait Details: unable due to +orhtostatics  Stairs            Wheelchair Mobility    Modified Rankin (Stroke Patients Only) Modified Rankin (Stroke Patients Only) Pre-Morbid Rankin Score: Slight disability Modified Rankin: Severe disability     Balance Overall balance assessment: Needs assistance Sitting-balance support: No upper extremity supported;Feet supported Sitting balance-Leahy Scale: Poor   Postural control: Right lateral lean Standing balance support: Bilateral upper extremity supported;During functional activity Standing balance-Leahy Scale: Poor Standing balance comment: reliant on external support                             Pertinent Vitals/Pain Pain Assessment: No/denies pain Faces Pain Scale: No hurt Pain Intervention(s): Monitored during session    Home Living Family/patient expects to be discharged to:: Private residence Living Arrangements: Spouse/significant other;Children Available Help at Discharge: Family Type of Home: [facility]  Access: Stairs to enter   [employer] of Steps: 1 Home Layout: One level Home Equipment: None      Prior Function Level of Independence: Independent         Comments: reporting girlfriend does the cooking and cleaning. He prepares simple microwaveable meals     Hand Dominance   Dominant Hand: Right    Extremity/Trunk Assessment   Upper Extremity Assessment Upper Extremity Assessment: Defer to OT evaluation RUE Deficits / Details: Pt with 4/5 grip strength BUE, and weakness in RUE gross strength as compared to LUE. With decreased coordinationof RUE. Pt reporting numbness of RUE.    Lower Extremity Assessment Lower Extremity Assessment: RLE deficits/detail RLE Sensation: decreased light  touch;decreased proprioception    Cervical / Trunk Assessment Cervical / Trunk Assessment: Normal  Communication   Communication: No difficulties  Cognition Arousal/Alertness: Awake/alert Behavior During Therapy: Impulsive Overall Cognitive Status: No family/caregiver present to determine baseline cognitive functioning Area of Impairment: Attention;Memory;Safety/judgement;Orientation;Following commands;Awareness;Problem solving                 Orientation Level: Disoriented to;Time Current Attention Level: Sustained Memory: Decreased short-term memory Following Commands: Follows one step commands consistently;Follows one step commands with increased time Safety/Judgement: Decreased awareness of safety;Decreased awareness of deficits Awareness: Intellectual Problem Solving: Slow processing;Requires verbal cues;Difficulty sequencing General Comments: Pt with decreased awareness of deficits, decreased attention to R side. With poor awareness, requiring verbal cues for safety throughout session. Pt recognizing unable to open toothpaste and asking for help. Pt reporting he probably would not try to get out of bed without help. Pt disoriented, reporting the year was 2013.      General Comments General comments (skin integrity, edema, etc.): Orthostatic BP: supine138/94, sitting 122/91, standing 106/67    Exercises     Assessment/Plan    PT Assessment Patient needs continued PT services  PT Problem List Decreased mobility;Decreased safety awareness;Decreased coordination;Decreased balance;Impaired sensation       PT Treatment Interventions Therapeutic activities;DME instruction;Gait training;Cognitive remediation;Therapeutic exercise;Patient/family education;Balance training;Functional mobility training;Stair training;Neuromuscular re-education    PT Goals (Current goals can be found in the Care Plan section)  Acute Rehab PT Goals Patient Stated Goal: get better PT Goal  Formulation: With patient Time For Goal Achievement: 04/13/21 Potential to Achieve Goals: Good    Frequency Min 4X/week   Barriers to discharge        Co-evaluation               AM-PAC PT "6 Clicks" Mobility  Outcome Measure Help needed turning from your back to your side while in a flat bed without using bedrails?: A Little Help needed moving from lying on your back to sitting on the side of a flat bed without using bedrails?: A Little Help needed moving to and from a bed to a chair (including a wheelchair)?: A Lot Help needed standing up from a chair using your arms (e.g., wheelchair or bedside chair)?: A Lot Help needed to walk in hospital room?: A Lot Help needed climbing 3-5 steps with a railing? : Total 6 Click Score: 13    End of Session Equipment Utilized During Treatment: Gait belt Activity Tolerance: Treatment limited secondary to medical complications (Comment) (+orthostatics) Patient left: in bed;with call bell/phone within reach;with bed alarm set Nurse Communication: Mobility status PT Visit Diagnosis: Unsteadiness on feet (R26.81);Muscle weakness (generalized) (M62.81);Other abnormalities of gait and mobility (R26.89)    Time: 1141-1200 PT Time Calculation (min) (ACUTE ONLY): 19 min   Charges:  PT Evaluation $PT Eval Moderate Complexity: 1 Mod          Aida Raider, [place]  Office # [phone] Pager [phone]   Ilda Foil 03/30/2021, 12:57 PM

## 2021-03-30 NOTE — Evaluation (Signed)
Occupational Therapy Evaluation Patient Details Name: Ricky Gonzalez MRN: [id number] DOB: 26-Sep-1971 Today's Date: 03/30/2021    History of Present Illness 50 y.o male presenting to ED with R sided numbness and weakness. 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   Clinical Impression   PTA, pt was independent and lived with his significant other and two sons. Currently, pt requiring max A for LB ADLs, mod A for UB ADLs, and mod A for sit<>stand transfers. Eval limited due to orthostatic hypotension this session. Deferring functional mobility due to decreased BP and dizziness with sitting EOB and standing. Pt presenting with decreased cognition, coordination, sitting balance, and strength. Due to pt age, motivation, support, and significant change in functional status, recommend continued OT at [facility] and will continue to follow acutely to optimize independence in ADLs.    BP: 121/83 supine; 85/60 (67) EOB; 114/84 (92) supine.    Follow Up Recommendations  CIR    Equipment Recommendations  None recommended by OT    Recommendations for Other Services Rehab consult     Precautions / Restrictions Precautions Precautions: None Precaution Comments: orthostatic hypotension during session. Restrictions Weight Bearing Restrictions: No      Mobility Bed Mobility Overal bed mobility: Needs Assistance Bed Mobility: Supine to Sit;Sit to Supine     Supine to sit: Min guard;Mod assist Sit to supine: Min guard   General bed mobility comments: Pt with ataxic movement coming to EOB. Initially requiring min Guard, but requiting min-mod A to avoid scooting off of EOB.    Transfers Overall transfer level: Needs assistance Equipment used: None Transfers: Sit to/from Stand Sit to Stand: Mod assist         General  transfer comment: Mod A for balance. Pt becoming dizzy with sit<>stand. deferring ambulation. Noting bucling of R knee.    Balance Overall balance assessment: Needs assistance Sitting-balance support: No upper extremity supported;Feet supported Sitting balance-Leahy Scale: Poor   Postural control: Right lateral lean Standing balance support: Bilateral upper extremity supported;During functional activity Standing balance-Leahy Scale: Poor Standing balance comment: reliant on external support                           ADL either performed or assessed with clinical judgement   ADL Overall ADL's : Needs assistance/impaired Eating/Feeding: Set up;Sitting   Grooming: Oral care;Minimal assistance;Sitting Grooming Details (indicate cue type and reason): Min A to place toothpase in R hand. Decreased problem solving noted wuth management of toothbrush, toothpaste, and toothpaste cap. Pt using RUE to brush teeth with decreased coordination, and noting tenodesis grasp. Pt missing mouth when attempting to put finger in mouth after getting toothpaste on finger due missing toothbrush. Pt with decreased attention to therapist when moving to pt's right side. Pt perfroming oral care in bed with HOB elevated. Upper Body Bathing: Sitting;Moderate assistance   Lower Body Bathing: Maximal assistance;Sit to/from stand Lower Body Bathing Details (indicate cue type and reason): Pt with decreased trubk control. Upper Body Dressing : Moderate assistance;Sitting   Lower Body Dressing: Maximal assistance;Sit to/from stand   Toilet Transfer: BSC;Stand-pivot;Moderate assistance   Toileting- Clothing Manipulation and Hygiene: Moderate assistance;Sit to/from stand       Functional mobility during ADLs: Moderate assistance General ADL Comments: Pt with orthostatic BP; defer functional mobilility for pt safety. Pt requiring  mod A for UB ADL and max A for LB ADLs due to decreaed sitting and standing  balance and decreased trunk control. Pt requiring cue for safety during ADLs.     Vision Baseline Vision/History: Wears glasses Wears Glasses: At all times Vision Assessment?: No apparent visual deficits Additional Comments: further assess in future session. R inattention.     Perception Perception Perception Tested?: Yes Perception Deficits: Inattention/neglect Inattention/Neglect: Does not attend to right visual field;Does not attend to right side of body Comments: Pt unaware of arm position in space, or position of therapist on R side, nearly elbowing therapist on R side when sitting EOB   Praxis Praxis Praxis tested?: Deficits Deficits: Limb apraxia Praxis-Other Comments: difficulty with motor planning of RUE as indicated by missing mouth when attempting to lick toothpaste off of finger    Pertinent Vitals/Pain Pain Assessment: Faces Faces Pain Scale: No hurt Pain Intervention(s): Monitored during session     Hand Dominance Right   Extremity/Trunk Assessment Upper Extremity Assessment Upper Extremity Assessment: Generalized weakness RUE Deficits / Details: Pt with 4/5 grip strength BUE, and weakness in RUE gross strength as compared to LUE. With decreased coordinationof RUE. Pt reporting numbness of RUE.   Lower Extremity Assessment Lower Extremity Assessment: Defer to PT evaluation RLE Sensation: decreased light touch;decreased proprioception   Cervical / Trunk Assessment Cervical / Trunk Assessment: Normal   Communication Communication Communication: No difficulties   Cognition Arousal/Alertness: Awake/alert Behavior During Therapy: WFL for tasks assessed/performed Overall Cognitive Status: No family/caregiver present to determine baseline cognitive functioning Area of Impairment: Attention;Memory;Safety/judgement;Orientation;Following commands;Awareness;Problem solving                 Orientation Level: Time Current Attention Level: Sustained Memory:  Decreased short-term memory Following Commands: Follows one step commands consistently;Follows one step commands with increased time Safety/Judgement: Decreased awareness of safety;Decreased awareness of deficits Awareness: Intellectual;Emergent Problem Solving: Slow processing;Requires verbal cues General Comments: Pt with decreased awareness of deficits, decreased attention to R side. With poor awareness, requiring verbal cues for safety throughout session. Pt recognizing unable to open toothpaste and asking for help, noting decreased problem solving with managing toothpaste, toothpaste cap, and toothbrush. Pt disoriented, reporting the year was 2013.   General Comments  BP: 121/83 supine; 85/60 (67) EOB; 114/84 (92) supine. Attempting to check BP in standing, however, pt becoming dizzy with inability to follow commands and returning to supine    Exercises     Shoulder Instructions      Home Living Family/patient expects to be discharged to:: Private residence Living Arrangements: Spouse/significant other;Children Available Help at Discharge: Family Type of Home: House Home Access: Stairs to enter Secretary/administrator of Steps: 1   Home Layout: One level     Bathroom Shower/Tub: Chief Strategy Officer: Standard     Home Equipment: None      Lives With: Family    Prior Functioning/Environment Level of Independence: Independent        Comments: reporting girlfriend does the cooking and cleaning. He prepares simple microwaveable meals, and perfroms yard work, and plays Surveyor, quantity        OT Problem List: Decreased strength;Decreased activity tolerance;Impaired balance (sitting and/or standing);Decreased coordination;Decreased cognition;Decreased safety awareness;Impaired sensation;Impaired UE functional use      OT Treatment/Interventions: Self-care/ADL training;Therapeutic exercise;Therapeutic activities;Cognitive  remediation/compensation;Patient/family education;DME and/or AE instruction    OT Goals(Current goals can be found in the care plan section) Acute Rehab OT Goals Patient Stated Goal: get better OT Goal  Formulation: With patient Time For Goal Achievement: 04/13/21 Potential to Achieve Goals: Good ADL Goals Pt Will Perform Upper Body Dressing: with modified independence;sitting Pt Will Perform Lower Body Dressing: sit to/from stand;with min assist Pt Will Transfer to Toilet: ambulating;regular height toilet;with min assist Pt Will Perform Toileting - Clothing Manipulation and hygiene: with min guard assist;sit to/from stand  OT Frequency: Min 2X/week   Barriers to D/C:            Co-evaluation              AM-PAC OT "6 Clicks" Daily Activity     Outcome Measure Help from another person eating meals?: A Little Help from another person taking care of personal grooming?: A Little Help from another person toileting, which includes using toliet, bedpan, or urinal?: A Lot Help from another person bathing (including washing, rinsing, drying)?: A Lot Help from another person to put on and taking off regular upper body clothing?: A Lot Help from another person to put on and taking off regular lower body clothing?: A Lot 6 Click Score: 14   End of Session Equipment Utilized During Treatment: Gait belt Nurse Communication: Mobility status;Other (comment) (Orthostatic)  Activity Tolerance: Patient tolerated treatment well Patient left: in bed;with call bell/phone within reach;with bed alarm set;with SCD's reapplied  OT Visit Diagnosis: Unsteadiness on feet (R26.81);Muscle weakness (generalized) (M62.81);Ataxia, unspecified (R27.0);Dizziness and giddiness (R42);Hemiplegia and hemiparesis Hemiplegia - Right/Left: Left Hemiplegia - dominant/non-dominant: Dominant Hemiplegia - caused by: Nontraumatic intracerebral hemorrhage                Time: 1030-1059 OT Time Calculation (min): 29  min Charges:  OT General Charges $OT Visit: 1 Visit OT Evaluation $OT Eval Moderate Complexity: 1 Mod OT Treatments $Self Care/Home Management : 8-22 mins  Ladene Artist, OTDS   Ladene Artist 03/30/2021, 1:40 PM

## 2021-03-30 NOTE — Progress Notes (Addendum)
STROKE TEAM PROGRESS NOTE   Interval History   No acute events overnight, patient is resting in bed with no family at bedside.   Neurological examination is stable   Patients blood pressure are trending 120-140, at goal   Pertinent Lab Work and Imaging    03/29/21 CT Head WO IV Contrast 4.5 mL acute intraparenchymal left thalamocapsular intraparenchymal hemorrhage. Mild surrounding edema without significant regional mass effect. No intraventricular extension. A hypertensive etiology is suspected.  03/29/21 CT Angio Head and Neck W WO IV Contrast Negative CTA of the head and neck. No large vessel occlusion. No high-grade or correctable stenosis identified. No aneurysm or vascular abnormality seen underlying the left cerebral hemorrhage.  03/29/21 MRI Brain W WO IV Contrast 2.1 x 1.5 x 2.8 cm acute parenchymal hemorrhage centered within the left thalamocapsular junction and corona radiata, unchanged in size from the head CT performed earlier today. Unchanged mild surrounding edema. Additionally, there is mild ill-defined enhancement surrounding the hemorrhage, but no masslike or nodular enhancement is demonstrated.  Chronic left basal ganglia lacunar infarct. Mild cerebral white matter chronic small vessel ischemic disease. Mild generalized parenchymal atrophy.  03/29/21 Echocardiogram Complete   1. Left ventricular ejection fraction, by estimation, is 65 to 70%. The left ventricle has normal function. The left ventricle has no regional wall motion abnormalities. There is severe concentric left ventricular hypertrophy. Left ventricular diastolic parameters are consistent with Grade I diastolic dysfunction (impaired relaxation).   2. Right ventricular systolic function is normal. The right ventricular size is normal. Tricuspid regurgitation signal is inadequate for assessing PA pressure.   3. The mitral valve is normal in structure. Trivial mitral valve regurgitation. No evidence of mitral  stenosis.   4. The aortic valve is tricuspid. Aortic valve regurgitation is not visualized. No aortic stenosis is present.   5. The inferior vena cava is normal in size with greater than 50% respiratory variability, suggesting right atrial pressure of 3 mmHg.   Physical Examination   Constitutional: Calm, appropriate for condition  Cardiovascular: Normal RR Respiratory: No increased WOB   Mental status: AAOx4 Speech: Fluent with intact naming and repetition  Cranial nerves: EOMI, VFF, Face symmetric, Tongue midline  Motor: Normal bulk and tone. Pronator drift to RUE.   Dlt Bic Tri FgS Grp HF  KnF KnE PIF DoF  R 5 5 5 5 5 5 5 5 5 5   L 5 5 5 5 4 5 5 5 5 5   Sensory: Does not feel light touch in the right arm and right leg  Coordination: FNF + HTS intact  Gait: Deferred given right sided sensory deficit   Assessment and Gonzalez   Mr. Ricky Gonzalez is a 50 y.o. 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 He did not receive IVTPA given bleed and was not a candidate for mechanical thrombectomy given lack of LVO.   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 Stroke work up is complete at this time. CTA Head and Neck was negative for vascular anomalies or aneurysm that could have caused his bleed. UDS negative. MRI Brain W WO contrast was pertinent for unchanged size of left IPH + minimal ill defined enhancement surrounding the hemorrhage. Echo w/EF 65 to 70 % and LA normal in size. Stroke labs w/A1C 4.7, LDL 77. Suspect bleed is in the setting of HTN given location +  initial SBP trends in the 160-200 range.  - No antiplatelets  - SBP goal < 160  - At discharge will place ambulatory referral to neurology for stroke follow up   #Substance Abuse  He smokes and drinks a pint of beer everyday at home. Smoking and drinking cessation education  provided. CIWA protocol in place for alcohol withdrawal.  - Continue CIWA   #Hypertension He has a history of HTN and states that on an outpatient basis he was prescribed Lisinopril but stopped taking it as it caused angioedema. He was not taking any blood pressure medications at home; after taking Lisinopril he was anxious about having another allergic reaction.  - Amlodipine 10 mg QD + Labetalol 200 mg BID started 03/29/21 - SBP goal < 160   #Hyperlipidemia From a stroke prevention stand point, the LDL goal is < 70. LDL 77, will initiate Atorvastatin 20 today  - Continue Atorvastatin 20 at discharge   #Stroke Dysphagia Screening  Passed bedside swallow evaluation for a regular diet   #Stroke Diabetes Screening  Hemoglobin A1C 4.7, at goal from a stroke prevention standpoint given its < 7   Hospital day # 1  Ricky Jock, NP  [facility] Nurse Practitioner Patient seen and discussed with attending physician Dr. Roda Shutters   Neurology will sign off at this time, please refer to Amion to reach [place] with any questions.   ATTENDING NOTE: I reviewed above note and agree with the assessment and Gonzalez. Pt was seen and examined.   No acute event overnight, mildly sleepy, easily arousable, follows simple commands.  Neuro stable, unchanged.  BP stable on current BP regimen, BP goal less than 160.  No evidence of DT, continue CIWA protocol.  EF 65 to 70%, A1c 4.7.  PT/OT recommend CIR.  Discussed with Dr. Jerral Ralph.  Neurology will sign off. Please call with questions. Pt will follow up with stroke clinic NP at [facility] in about 4 weeks. Thanks for the consult.   Ricky Plan, MD PhD Stroke Neurology 03/30/2021 11:29 AM    To contact Stroke Continuity provider, please refer to [email or web address]. After hours, contact General Neurology

## 2021-03-30 NOTE — Plan of Care (Signed)
  Problem: Education: Goal: Knowledge of General Education information will improve Description: Including pain rating scale, medication(s)/side effects and non-pharmacologic comfort measures Outcome: Progressing   Problem: Health Behavior/Discharge Planning: Goal: Ability to manage health-related needs will improve Outcome: Progressing   Problem: Clinical Measurements: Goal: Ability to maintain clinical measurements within normal limits will improve Outcome: Progressing Goal: Will remain free from infection Outcome: Progressing Goal: Diagnostic test results will improve Outcome: Progressing Goal: Respiratory complications will improve Outcome: Progressing Goal: Cardiovascular complication will be avoided Outcome: Progressing   Problem: Activity: Goal: Risk for activity intolerance will decrease Outcome: Progressing   Problem: Nutrition: Goal: Adequate nutrition will be maintained Outcome: Progressing   Problem: Coping: Goal: Level of anxiety will decrease Outcome: Progressing   Problem: Elimination: Goal: Will not experience complications related to bowel motility Outcome: Progressing Goal: Will not experience complications related to urinary retention Outcome: Progressing   Problem: Pain Managment: Goal: General experience of comfort will improve Outcome: Progressing   Problem: Safety: Goal: Ability to remain free from injury will improve Outcome: Progressing   Problem: Skin Integrity: Goal: Risk for impaired skin integrity will decrease Outcome: Progressing   Problem: Education: Goal: Knowledge of disease or condition will improve Outcome: Progressing Goal: Knowledge of secondary prevention will improve Outcome: Progressing Goal: Knowledge of patient specific risk factors addressed and post discharge goals established will improve Outcome: Progressing   Problem: Coping: Goal: Will verbalize positive feelings about self Outcome: Progressing   Problem:  Self-Care: Goal: Ability to participate in self-care as condition permits will improve Outcome: Progressing   Problem: Intracerebral Hemorrhage Tissue Perfusion: Goal: Complications of Intracerebral Hemorrhage will be minimized Outcome: Progressing   

## 2021-03-30 NOTE — Progress Notes (Signed)
PROGRESS NOTE        PATIENT DETAILS Name: Ricky Gonzalez Age: 50 y.o. Sex: male Date of Birth: [birth year] Admit Date: 03/29/2021 Admitting Physician Rejeana BrockMcNeill P Kirkpatrick, MD PCP:Pcp, No  Brief Narrative: Patient is a 50 y.o. 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  Significant events: 6/27>> admit to neuro ICU for left thalamic intraparenchymal hemorrhage. 6/28>> [facility]  Significant studies: 6/27>> CT head: 4.5 mm acute intraparenchymal left thalamocapsular intraparenchymal hemorrhage 6/27>> CTA head/neck: No LVO, no high-grade stenosis, no aneurysm or vascular abnormality. 6/27>> MRI brain: Acute parenchymal hemorrhage within the left thalamocapsular junction and corona radiata 6/27>> TTE: EF 65-70%, grade 1 diastolic dysfunction. 6/27>> UDS: Negative 6/27>> A1c: 4.7 6/27>> LDL: 77  Antimicrobial therapy: None  Microbiology data: 6/27>> influenza/COVID PCR: Negative  Procedures : None  Consults: Neurology  DVT Prophylaxis : SCD's Start: 03/29/21 0533   Subjective: Awake/alert-some tremors.  Assessment/Plan: Left thalamic intraparenchymal hemorrhage: Suspected hypertensive etiology-no aneurysm/vascular abnormality seen on CT angiogram.  No obvious focal deficits on exam-need to mobilize with PT OT and start preparing for discharge.  HTN: Noncompliant-currently BP stable with amlodipine, labetalol  Alcohol withdrawal: Some mild tremors-continue Ativan per CIWA protocol.  On MVI/thiamine as well.  Counseled regarding importance of complete abstinence from EtOH use.  Tobacco use: We will need counseling going forward.  Diet: Diet Order             Diet Heart Room service appropriate? Yes; Fluid consistency: Thin  Diet effective now                    Code Status: Full code    Family Communication: None at bedside  Disposition Plan: Home vs Home with Home health vs SNF when ready for discharge Status is: Inpatient  Remains inpatient appropriate because:Inpatient level of care appropriate due to severity of illness  Dispo: The patient is from: Home              Anticipated d/c is to: Home              Patient currently is not medically stable to d/c.   Difficult to place patient No    Barriers to Discharge: Intraparenchymal hemorrhage-left thalamus-needs PT/OT eval to ensure safe disposition.  Ongoing alcohol withdrawal symptoms as well.  Antimicrobial agents: Anti-infectives (From admission, onward)    None        Time spent: 35 minutes-Greater than 50% of this time was spent in counseling, explanation of diagnosis, planning of further management, and coordination of care.  MEDICATIONS: Scheduled Meds:  amLODipine  10 mg Oral Daily   Chlorhexidine Gluconate Cloth  6 each Topical Daily   folic acid  1 mg Oral Daily   labetalol  200 mg Oral BID   LORazepam  0-4 mg Oral Q4H   Followed by   Melene Muller[START ON 03/31/2021] LORazepam  0-4 mg Oral Q8H   multivitamin with minerals  1 tablet Oral Daily   pantoprazole  40 mg Oral Daily   senna-docusate  1 tablet Oral BID   thiamine  100 mg Oral Daily   Or   thiamine  100 mg Intravenous Daily   Continuous Infusions: PRN Meds:.acetaminophen **OR** acetaminophen (TYLENOL) oral liquid 160 mg/5 mL **OR**  acetaminophen, labetalol, LORazepam **OR** [DISCONTINUED] LORazepam   PHYSICAL EXAM: Vital signs: Vitals:   03/29/21 2355 03/30/21 0412 03/30/21 0750 03/30/21 1100  BP: 135/85 (!) 138/93 (!) 147/94 127/90  Pulse: 81 84 72 75  Resp: 18 17 18 15   Temp: 99 F (37.2 C) 99.3 F (37.4 C) 98.6 F (37 C) 98.6 F (37 C)  TempSrc: Oral Oral Oral Oral  SpO2: 100% 98% 99% 100%  Weight:      Height:       Filed Weights   03/29/21 0028  Weight: 65.8 kg   Body mass index is 21.41 kg/m.   Gen Exam:Alert  awake-not in any distress HEENT:atraumatic, normocephalic Chest: B/L clear to auscultation anteriorly CVS:S1S2 regular Abdomen:soft non tender, non distended Extremities:no edema Neurology: Non focal Skin: no rash  I have personally reviewed following labs and imaging studies  LABORATORY DATA: CBC: Recent Labs  Lab 03/29/21 0030 03/29/21 0702  WBC 5.0 5.8  NEUTROABS 1.3*  --   HGB 14.2 14.7  HCT 41.7 43.0  MCV 84.9 85.1  PLT 212 204    Basic Metabolic Panel: Recent Labs  Lab 03/29/21 0030 03/29/21 0702  NA 138 137  K 3.5 3.5  CL 98 98  CO2 28 24  GLUCOSE 97 84  BUN 13 9  CREATININE 1.09 0.89  CALCIUM 9.0 9.3  MG  --  1.9  PHOS  --  2.6    GFR: Estimated Creatinine Clearance: 93.4 mL/min (by C-G formula based on SCr of 0.89 mg/dL).  Liver Function Tests: Recent Labs  Lab 03/29/21 0030 03/29/21 0702  AST 57* 43*  ALT 44 43  ALKPHOS 60 60  BILITOT 1.2 1.4*  PROT 7.6 7.1  ALBUMIN 4.5 4.2   No results for input(s): LIPASE, AMYLASE in the last 168 hours. No results for input(s): AMMONIA in the last 168 hours.  Coagulation Profile: Recent Labs  Lab 03/29/21 0030  INR 1.0    Cardiac Enzymes: No results for input(s): CKTOTAL, CKMB, CKMBINDEX, TROPONINI in the last 168 hours.  BNP (last 3 results) No results for input(s): PROBNP in the last 8760 hours.  Lipid Profile: Recent Labs    03/29/21 0702  CHOL 248*  HDL 118  LDLCALC 77  TRIG 03/31/21*  CHOLHDL 2.1    Thyroid Function Tests: No results for input(s): TSH, T4TOTAL, FREET4, T3FREE, THYROIDAB in the last 72 hours.  Anemia Panel: No results for input(s): VITAMINB12, FOLATE, FERRITIN, TIBC, IRON, RETICCTPCT in the last 72 hours.  Urine analysis:    Component Value Date/Time   COLORURINE YELLOW 03/29/2021 0220   APPEARANCEUR CLEAR 03/29/2021 0220   LABSPEC 1.010 03/29/2021 0220   PHURINE 7.0 03/29/2021 0220   GLUCOSEU NEGATIVE 03/29/2021 0220   HGBUR NEGATIVE 03/29/2021 0220    BILIRUBINUR NEGATIVE 03/29/2021 0220   KETONESUR NEGATIVE 03/29/2021 0220   PROTEINUR NEGATIVE 03/29/2021 0220   NITRITE NEGATIVE 03/29/2021 0220   LEUKOCYTESUR NEGATIVE 03/29/2021 0220    Sepsis Labs: Lactic Acid, Venous No results found for: LATICACIDVEN  MICROBIOLOGY: Recent Results (from the past 240 hour(s))  Resp Panel by RT-PCR (Flu A&B, Covid) Nasopharyngeal Swab     Status: None   Collection Time: 03/29/21 12:30 AM   Specimen: Nasopharyngeal Swab; Nasopharyngeal(NP) swabs in vial transport medium  Result Value Ref Range Status   SARS Coronavirus 2 by RT PCR NEGATIVE NEGATIVE Final    Comment: (NOTE) SARS-CoV-2 target nucleic acids are NOT DETECTED.  The SARS-CoV-2 RNA is generally detectable in upper respiratory specimens  during the acute phase of infection. The lowest concentration of SARS-CoV-2 viral copies this assay can detect is 138 copies/mL. A negative result does not preclude SARS-Cov-2 infection and should not be used as the sole basis for treatment or other patient management decisions. A negative result may occur with  improper specimen collection/handling, submission of specimen other than nasopharyngeal swab, presence of viral mutation(s) within the areas targeted by this assay, and inadequate number of viral copies(<138 copies/mL). A negative result must be combined with clinical observations, patient history, and epidemiological information. The expected result is Negative.  Fact Sheet for Patients:  BloggerCourse.com  Fact Sheet for Healthcare Providers:  SeriousBroker.it  This test is no t yet approved or cleared by the [place] FDA and  has been authorized for detection and/or diagnosis of SARS-CoV-2 by FDA under an Emergency Use Authorization (EUA). This EUA will remain  in effect (meaning this test can be used) for the duration of the COVID-19 declaration under Section 564(b)(1) of the Act,  21 U.S.C.section 360bbb-3(b)(1), unless the authorization is terminated  or revoked sooner.       Influenza A by PCR NEGATIVE NEGATIVE Final   Influenza B by PCR NEGATIVE NEGATIVE Final    Comment: (NOTE) The Xpert Xpress SARS-CoV-2/FLU/RSV plus assay is intended as an aid in the diagnosis of influenza from Nasopharyngeal swab specimens and should not be used as a sole basis for treatment. Nasal washings and aspirates are unacceptable for Xpert Xpress SARS-CoV-2/FLU/RSV testing.  Fact Sheet for Patients: BloggerCourse.com  Fact Sheet for Healthcare Providers: SeriousBroker.it  This test is not yet approved or cleared by the [place] FDA and has been authorized for detection and/or diagnosis of SARS-CoV-2 by FDA under an Emergency Use Authorization (EUA). This EUA will remain in effect (meaning this test can be used) for the duration of the COVID-19 declaration under Section 564(b)(1) of the Act, 21 U.S.C. section 360bbb-3(b)(1), unless the authorization is terminated or revoked.  Performed at [facility], [address], [place], [place] [address]   Surgical pcr screen     Status: None   Collection Time: 03/29/21  5:37 AM   Specimen: Nasal Mucosa; Nasal Swab  Result Value Ref Range Status   MRSA, PCR NEGATIVE NEGATIVE Final   Staphylococcus aureus NEGATIVE NEGATIVE Final    Comment: (NOTE) The Xpert SA Assay (FDA approved for NASAL specimens in patients 71 years of age and older), is one component of a comprehensive surveillance program. It is not intended to diagnose infection nor to guide or monitor treatment. Performed at [facility], [address] [place], [place] [address]     RADIOLOGY STUDIES/RESULTS: CT Angio Head W or Wo Contrast  Result Date: 03/29/2021 CLINICAL DATA:  Initial evaluation for neuro deficit, stroke. EXAM: CT ANGIOGRAPHY HEAD AND NECK TECHNIQUE: Multidetector CT  imaging of the head and neck was performed using the standard protocol during bolus administration of intravenous contrast. Multiplanar CT image reconstructions and MIPs were obtained to evaluate the vascular anatomy. Carotid stenosis measurements (when applicable) are obtained utilizing NASCET criteria, using the distal internal carotid diameter as the denominator. CONTRAST:  OMNIPAQUE IOHEXOL 350 MG/ML SOLN COMPARISON:  Prior CT from earlier the same day. FINDINGS: CTA NECK FINDINGS Aortic arch: Visualized aortic arch normal caliber with normal branch pattern. No stenosis seen about the origin the great vessels. Right carotid system: Right common and internal carotid arteries widely patent without stenosis, dissection or occlusion. Left carotid system: Left  common and internal carotid arteries widely patent without stenosis, dissection or occlusion. Vertebral arteries: Both vertebral arteries arise from the subclavian arteries. No proximal subclavian artery stenosis. Right vertebral artery dominant. Atheromatous plaque at the origin of the left vertebral artery with mild stenosis. Vertebral arteries otherwise patent within the neck without stenosis dissection or occlusion. Skeleton: Prior fusion at C3-C5. No discrete or worrisome osseous lesions. Other neck: No other acute soft tissue abnormality within the neck. No mass or adenopathy. 4 mm left thyroid nodule, of doubtful significance given size and patient age, no follow-up imaging recommended (ref: J Am Coll Radiol. 2015 Feb;12(2): 143-50). Upper chest: Visualized upper chest demonstrates no acute finding. Review of the MIP images confirms the above findings CTA HEAD FINDINGS Anterior circulation: Both internal carotid arteries widely patent to the termini without stenosis. A1 segments widely patent. Normal anterior communicating artery complex. Both anterior cerebral arteries widely patent to their distal aspects without stenosis. No M1 stenosis or  occlusion. Normal MCA bifurcations. Distal MCA branches well perfused and symmetric. Posterior circulation: Both V4 segments patent to the vertebrobasilar junction without stenosis. Both PICA origins patent and normal. Basilar patent to its distal aspect without stenosis. Superior cerebellar arteries patent bilaterally. Fetal type origin left PCA. Hypoplastic right P1 and robust right posterior communicating artery supplies the right PCA. Both PCAs well perfused or distal aspects. Venous sinuses: Patent allowing for timing the contrast bolus. Anatomic variants: Predominant fetal type origin of the PCAs. No aneurysm. No vascular abnormality seen underlying the left cerebral hemorrhage. Review of the MIP images confirms the above findings IMPRESSION: Negative CTA of the head and neck. No large vessel occlusion. No high-grade or correctable stenosis identified. No aneurysm or vascular abnormality seen underlying the left cerebral hemorrhage. Electronically Signed   By: Rise Mu M.D.   On: 03/29/2021 02:46   CT Angio Neck W and/or Wo Contrast  Result Date: 03/29/2021 CLINICAL DATA:  Initial evaluation for neuro deficit, stroke. EXAM: CT ANGIOGRAPHY HEAD AND NECK TECHNIQUE: Multidetector CT imaging of the head and neck was performed using the standard protocol during bolus administration of intravenous contrast. Multiplanar CT image reconstructions and MIPs were obtained to evaluate the vascular anatomy. Carotid stenosis measurements (when applicable) are obtained utilizing NASCET criteria, using the distal internal carotid diameter as the denominator. CONTRAST:  OMNIPAQUE IOHEXOL 350 MG/ML SOLN COMPARISON:  Prior CT from earlier the same day. FINDINGS: CTA NECK FINDINGS Aortic arch: Visualized aortic arch normal caliber with normal branch pattern. No stenosis seen about the origin the great vessels. Right carotid system: Right common and internal carotid arteries widely patent without stenosis,  dissection or occlusion. Left carotid system: Left common and internal carotid arteries widely patent without stenosis, dissection or occlusion. Vertebral arteries: Both vertebral arteries arise from the subclavian arteries. No proximal subclavian artery stenosis. Right vertebral artery dominant. Atheromatous plaque at the origin of the left vertebral artery with mild stenosis. Vertebral arteries otherwise patent within the neck without stenosis dissection or occlusion. Skeleton: Prior fusion at C3-C5. No discrete or worrisome osseous lesions. Other neck: No other acute soft tissue abnormality within the neck. No mass or adenopathy. 4 mm left thyroid nodule, of doubtful significance given size and patient age, no follow-up imaging recommended (ref: J Am Coll Radiol. 2015 Feb;12(2): 143-50). Upper chest: Visualized upper chest demonstrates no acute finding. Review of the MIP images confirms the above findings CTA HEAD FINDINGS Anterior circulation: Both internal carotid arteries widely patent to the termini without stenosis. A1  segments widely patent. Normal anterior communicating artery complex. Both anterior cerebral arteries widely patent to their distal aspects without stenosis. No M1 stenosis or occlusion. Normal MCA bifurcations. Distal MCA branches well perfused and symmetric. Posterior circulation: Both V4 segments patent to the vertebrobasilar junction without stenosis. Both PICA origins patent and normal. Basilar patent to its distal aspect without stenosis. Superior cerebellar arteries patent bilaterally. Fetal type origin left PCA. Hypoplastic right P1 and robust right posterior communicating artery supplies the right PCA. Both PCAs well perfused or distal aspects. Venous sinuses: Patent allowing for timing the contrast bolus. Anatomic variants: Predominant fetal type origin of the PCAs. No aneurysm. No vascular abnormality seen underlying the left cerebral hemorrhage. Review of the MIP images confirms  the above findings IMPRESSION: Negative CTA of the head and neck. No large vessel occlusion. No high-grade or correctable stenosis identified. No aneurysm or vascular abnormality seen underlying the left cerebral hemorrhage. Electronically Signed   By: Rise Mu M.D.   On: 03/29/2021 02:46   MR BRAIN W WO CONTRAST  Result Date: 03/29/2021 CLINICAL DATA:  Cerebral hemorrhage suspected. EXAM: MRI HEAD WITHOUT AND WITH CONTRAST TECHNIQUE: Multiplanar, multiecho pulse sequences of the brain and surrounding structures were obtained without and with intravenous contrast. CONTRAST:  6.10mL GADAVIST GADOBUTROL 1 MMOL/ML IV SOLN COMPARISON:  Noncontrast head CT and CT angiogram head/neck 03/29/2021. FINDINGS: Brain: Mild intermittent motion degradation. Mild generalized cerebral and cerebellar atrophy. A 2.1 x 1.5 x 2.8 cm acute parenchymal hemorrhage centered within the left thalamocapsular junction and left corona radiata is unchanged in size as compared to the head CT performed earlier today. Mild surrounding edema. There is minimal ill-defined enhancement surrounding the hemorrhage. No masslike or nodular enhancement is demonstrated. Chronic lacunar infarct within the left lentiform nucleus (series 7, image 15). Elsewhere, minimal multifocal T2/FLAIR hyperintensity within the cerebral white matter is nonspecific, but compatible with chronic small vessel ischemic disease. The hippocampi are symmetric in size and signal. Possible punctate chronic microhemorrhage within the anterior left temporal lobe (series 10, image 10). There is no acute infarct. No evidence of an intracranial mass. No extra-axial fluid collection. No midline shift. Vascular: Expected proximal arterial flow voids. Skull and upper cervical spine: No focal marrow lesion. Susceptibility artifact arising from spinal fusion hardware. Sinuses/Orbits: Visualized orbits show no acute finding. Chronic medially displaced fracture deformity of the  right lamina papyracea. Other: 12 mm right forehead lipoma. IMPRESSION: 2.1 x 1.5 x 2.8 cm acute parenchymal hemorrhage centered within the left thalamocapsular junction and corona radiata, unchanged in size from the head CT performed earlier today. Unchanged mild surrounding edema. Additionally, there is mild ill-defined enhancement surrounding the hemorrhage, but no masslike or nodular enhancement is demonstrated. Chronic left basal ganglia lacunar infarct. Mild cerebral white matter chronic small vessel ischemic disease. Mild generalized parenchymal atrophy. Electronically Signed   By: Jackey Loge DO   On: 03/29/2021 11:41   ECHOCARDIOGRAM COMPLETE  Result Date: 03/29/2021    ECHOCARDIOGRAM REPORT   Patient Name:   Ricky Henri Date of Exam: 03/29/2021 Medical Rec #:  [id number]    Height:       69.0 in Accession #:    [id number]   Weight:       145.0 lb Date of Birth:  07/05/1971   BSA:          1.802 m Patient Age:    49 years     BP:           130/87 mmHg  Patient Gender: M            HR:           86 bpm. Exam Location:  Inpatient Procedure: 2D Echo, Cardiac Doppler and Color Doppler Indications:    Stroke  History:        Patient has no prior history of Echocardiogram examinations.                 Stroke; Risk Factors:Hypertension.  Sonographer:    Shirlean Kelly Referring Phys: [phone] MCNEILL P KIRKPATRICK IMPRESSIONS  1. Left ventricular ejection fraction, by estimation, is 65 to 70%. The left ventricle has normal function. The left ventricle has no regional wall motion abnormalities. There is severe concentric left ventricular hypertrophy. Left ventricular diastolic  parameters are consistent with Grade I diastolic dysfunction (impaired relaxation).  2. Right ventricular systolic function is normal. The right ventricular size is normal. Tricuspid regurgitation signal is inadequate for assessing PA pressure.  3. The mitral valve is normal in structure. Trivial mitral valve regurgitation. No evidence of  mitral stenosis.  4. The aortic valve is tricuspid. Aortic valve regurgitation is not visualized. No aortic stenosis is present.  5. The inferior vena cava is normal in size with greater than 50% respiratory variability, suggesting right atrial pressure of 3 mmHg. Comparison(s): No prior Echocardiogram. FINDINGS  Left Ventricle: Left ventricular ejection fraction, by estimation, is 65 to 70%. The left ventricle has normal function. The left ventricle has no regional wall motion abnormalities. The left ventricular internal cavity size was small. There is severe concentric left ventricular hypertrophy. Left ventricular diastolic parameters are consistent with Grade I diastolic dysfunction (impaired relaxation). Right Ventricle: The right ventricular size is normal. No increase in right ventricular wall thickness. Right ventricular systolic function is normal. Tricuspid regurgitation signal is inadequate for assessing PA pressure. Left Atrium: Left atrial size was normal in size. Right Atrium: Right atrial size was normal in size. Pericardium: There is no evidence of pericardial effusion. Mitral Valve: The mitral valve is normal in structure. Trivial mitral valve regurgitation. No evidence of mitral valve stenosis. Tricuspid Valve: The tricuspid valve is normal in structure. Tricuspid valve regurgitation is not demonstrated. No evidence of tricuspid stenosis. Aortic Valve: The aortic valve is tricuspid. Aortic valve regurgitation is not visualized. No aortic stenosis is present. Aortic valve mean gradient measures 5.0 mmHg. Aortic valve peak gradient measures 7.0 mmHg. Aortic valve area, by VTI measures 1.96 cm. Pulmonic Valve: The pulmonic valve was grossly normal. Pulmonic valve regurgitation is not visualized. No evidence of pulmonic stenosis. Aorta: The aortic root and ascending aorta are structurally normal, with no evidence of dilitation. Venous: The inferior vena cava is normal in size with greater than 50%  respiratory variability, suggesting right atrial pressure of 3 mmHg. IAS/Shunts: The atrial septum is grossly normal.  LEFT VENTRICLE PLAX 2D LVIDd:         3.60 cm  Diastology LVIDs:         2.00 cm  LV e' medial:    7.62 cm/s LV PW:         1.40 cm  LV E/e' medial:  8.5 LV IVS:        1.50 cm  LV e' lateral:   8.92 cm/s LVOT diam:     2.10 cm  LV E/e' lateral: 7.3 LV SV:         53 LV SV Index:   29 LVOT Area:     3.46 cm  RIGHT VENTRICLE             IVC RV Basal diam:  3.40 cm     IVC diam: 1.00 cm RV S prime:     10.10 cm/s TAPSE (M-mode): 1.9 cm LEFT ATRIUM             Index       RIGHT ATRIUM           Index LA diam:        3.05 cm 1.69 cm/m  RA Area:     11.10 cm LA Vol (A2C):   49.7 ml 27.58 ml/m RA Volume:   24.70 ml  13.71 ml/m LA Vol (A4C):   34.9 ml 19.37 ml/m LA Biplane Vol: 45.4 ml 25.19 ml/m  AORTIC VALVE AV Area (Vmax):    2.21 cm AV Area (Vmean):   2.10 cm AV Area (VTI):     1.96 cm AV Vmax:           132.00 cm/s AV Vmean:          102.000 cm/s AV VTI:            0.268 m AV Peak Grad:      7.0 mmHg AV Mean Grad:      5.0 mmHg LVOT Vmax:         84.20 cm/s LVOT Vmean:        61.800 cm/s LVOT VTI:          0.152 m LVOT/AV VTI ratio: 0.57  AORTA Ao Root diam: 3.00 cm Ao Asc diam:  3.00 cm MITRAL VALVE MV Area (PHT): 3.57 cm    SHUNTS MV Decel Time: 213 msec    Systemic VTI:  0.15 m MV E velocity: 64.75 cm/s  Systemic Diam: 2.10 cm MV A velocity: 62.35 cm/s MV E/A ratio:  1.04 Riley Lam MD Electronically signed by Riley Lam MD Signature Date/Time: 03/29/2021/2:37:48 PM    Final    CT HEAD CODE STROKE WO CONTRAST  Result Date: 03/29/2021 CLINICAL DATA:  Code stroke. Initial evaluation for acute right-sided numbness and weakness. EXAM: CT HEAD WITHOUT CONTRAST TECHNIQUE: Contiguous axial images were obtained from the base of the skull through the vertex without intravenous contrast. COMPARISON:  None. FINDINGS: Brain: Acute intraparenchymal hemorrhage centered at the  left thalamus/internal capsule measures 2.1 x 1.5 x 2.8 cm (estimated volume 4.5 cc). Mild surrounding edema without significant regional mass effect. No visible intraventricular extension. No other acute intracranial hemorrhage. No other acute large vessel territory infarct. No mass lesion or midline shift. No hydrocephalus or extra-axial fluid collection. Vascular: No hyperdense vessel. Scattered vascular calcifications noted within the carotid siphons. Skull: Small lipoma noted at the right frontal scalp. Calvarium intact. Sinuses/Orbits: Globes orbital soft tissues demonstrate no acute finding. Remote posttraumatic defect at the right lamina papyracea. Paranasal sinuses are clear. Small chronic right mastoid effusion noted. Other: None. ASPECTS [facility] Stroke Program Early CT Score) Does not apply, acute intracranial hemorrhage. IMPRESSION: 4.5 mL acute intraparenchymal left thalamocapsular intraparenchymal hemorrhage. Mild surrounding edema without significant [place]. No intraventricular extension. A hypertensive etiology is suspected. Critical Value/emergent results were called by telephone at the time of interpretation on 03/29/2021 at 12:52 am to provider [facility] , who verbally acknowledged these results. Electronically Signed   By: Rise Mu M.D.   On: 03/29/2021 00:54     LOS: 1 day   Jeoffrey Massed, MD  [facility]    To contact the attending provider  between 7A-7P or the covering provider during after hours 7P-7A, please log into the web site [email or web address] and access using universal [place] password for that web site. If you do not have the password, please call the hospital operator.  03/30/2021, 11:12 AM

## 2021-03-30 NOTE — Progress Notes (Signed)
Rehab Admissions Coordinator Note:  Patient was screened by Clois Dupes for appropriateness for an Inpatient Acute Rehab Consult.  At this time, we are recommending Inpatient Rehab consult. I will place order per protocol.  Clois Dupes RN MSN 03/30/2021, 2:00 PM  I can be reached at [phone].

## 2021-03-31 LAB — COMPREHENSIVE METABOLIC PANEL
ALT: 31 U/L (ref 0–44)
AST: 39 U/L (ref 15–41)
Albumin: 3.8 g/dL (ref 3.5–5.0)
Alkaline Phosphatase: 53 U/L (ref 38–126)
Anion gap: 9 (ref 5–15)
BUN: 6 mg/dL (ref 6–20)
CO2: 26 mmol/L (ref 22–32)
Calcium: 9.3 mg/dL (ref 8.9–10.3)
Chloride: 101 mmol/L (ref 98–111)
Creatinine, Ser: 1.17 mg/dL (ref 0.61–1.24)
GFR, Estimated: >60 mL/min (ref >60)
Glucose, Bld: 93 mg/dL (ref 70–99)
Potassium: 3.4 mmol/L — ABNORMAL LOW (ref 3.5–5.1)
Sodium: 136 mmol/L (ref 135–145)
Total Bilirubin: 1.6 mg/dL — ABNORMAL HIGH (ref 0.3–1.2)
Total Protein: 6.5 g/dL (ref 6.5–8.1)

## 2021-03-31 LAB — MAGNESIUM: Magnesium: 2 mg/dL (ref 1.7–2.4)

## 2021-03-31 MED ORDER — POTASSIUM CHLORIDE CRYS ER 20 MEQ PO TBCR
40.0000 meq | EXTENDED_RELEASE_TABLET | Freq: Once | ORAL | Status: AC
  Administered 2021-03-31: 40 meq via ORAL
  Filled 2021-03-31: qty 2

## 2021-03-31 NOTE — Progress Notes (Signed)
Inpatient Rehab Admissions Coordinator:   Inpatient rehab consult received.  Unfortunately, [facility] does not have a contract with Pt.'s insurer ([place] [employer] [place]), so we are unable to accept this Pt. I will notify TOC that she will need referrals to facilities that accept her insurance.   Megan Salon, MS, CCC-SLP Rehab Admissions Coordinator  [phone] (celll) [phone] (office)

## 2021-03-31 NOTE — TOC CAGE-AID Note (Signed)
Transition of Care [facility]) - CAGE-AID Screening   Patient Details  Name: Prayan Ulin MRN: [id number] Date of Birth: [birth year]  [facility]) CM/SW Contact:    Kermit Balo, RN Phone Number: 03/31/2021, 1:23 PM   Clinical Narrative: Patient states he doesn't need the counseling resources.    CAGE-AID Screening:    Have You Ever Felt You Ought to Cut Down on Your Drinking or Drug Use?: Yes Have People Annoyed You By Critizing Your Drinking Or Drug Use?: No Have You Felt Bad Or Guilty About Your Drinking Or Drug Use?: No Have You Ever Had a Drink or Used Drugs First Thing In The Morning to Steady Your Nerves or to Get Rid of a Hangover?: No CAGE-AID Score: 1  Substance Abuse Education Offered: Yes (patient denied counseling resources)

## 2021-03-31 NOTE — Progress Notes (Signed)
Physical Therapy Treatment Patient Details Name: Ricky Gonzalez MRN: [id number] DOB: 1971-09-07 Today's Date: 03/31/2021    History of Present Illness 50 y.o male presenting to ED with R sided numbness and weakness. 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    PT Comments    Patient with improved BP control with transitions this session (- orthostatics, see General Comments). Patient required minA+2 for sit to stand to steady upon standing due to ataxia and decreased sensation. Required modA+2 for sidesteps towards [facility] with difficulty extending R knee during stance phase requiring assistance to prevent buckling. Instructed patient on exercises in supine and seated EOB to promote strengthening and eccentric control of B LE. Continue to recommend comprehensive inpatient rehab (CIR) for post-acute therapy needs.     Follow Up Recommendations  CIR     Equipment Recommendations  Other (comment) (TBD)    Recommendations for Other Services       Precautions / Restrictions Precautions Precautions: Fall Precaution Comments: ataxia Restrictions Weight Bearing Restrictions: No    Mobility  Bed Mobility Overal bed mobility: Needs Assistance Bed Mobility: Supine to Sit;Sit to Supine     Supine to sit: Min guard Sit to supine: Min guard   General bed mobility comments: min guard for safety due to ataxic and uncontrolled movements towards EOB    Transfers Overall transfer level: Needs assistance Equipment used: 2 person hand held assist Transfers: Sit to/from Stand Sit to Stand: Min assist;+2 physical assistance;+2 safety/equipment         General transfer comment: minA+2 to rise and steady. Patient with ataxic movements and difficulty steadying self. Bracing B LEs against bed and posterior lean in  standing.  Ambulation/Gait Ambulation/Gait assistance: Mod assist;+2 physical assistance;+2 safety/equipment Gait Distance (Feet): 2 Feet Assistive device: 2 person hand held assist Gait Pattern/deviations: Step-to pattern;Decreased stride length;Ataxic Gait velocity: decreased   General Gait Details: sidesteps towards [facility] with difficulty extending R knee during stance phase requiring physical assistance to prevent buckling. Varying BOS noted due to incoordination   Stairs             Wheelchair Mobility    Modified Rankin (Stroke Patients Only) Modified Rankin (Stroke Patients Only) Pre-Morbid Rankin Score: Slight disability Modified Rankin: Moderately severe disability     Balance Overall balance assessment: Needs assistance Sitting-balance support: No upper extremity supported;Feet supported Sitting balance-Leahy Scale: Poor Sitting balance - Comments: required minA to maintain forward posture due to mild truncal ataxia Postural control: Posterior lean Standing balance support: Bilateral upper extremity supported;During functional activity Standing balance-Leahy Scale: Poor Standing balance comment: reliant on external assist and bracing B LEs against bed                            Cognition Arousal/Alertness: Awake/alert Behavior During Therapy: WFL for tasks assessed/performed Overall Cognitive Status: Impaired/Different from baseline Area of Impairment: Attention;Memory;Safety/judgement;Following commands;Awareness;Problem solving                   Current Attention Level: Sustained Memory: Decreased short-term memory Following Commands: Follows one step commands consistently;Follows one step commands with increased time Safety/Judgement: Decreased awareness of safety;Decreased awareness of deficits Awareness: Intellectual;Emergent Problem Solving: Slow processing;Requires verbal cues General Comments: Decreased attention to R side required  cues to attend during mobility.  Exercises General Exercises - Lower Extremity Long Arc Quad: AROM;Both;10 reps;Seated Heel Slides: AROM;Both;10 reps;Supine Hip ABduction/ADduction: AROM;Both;10 reps;Supine Straight Leg Raises: AROM;Both;10 reps;Supine Hip Flexion/Marching: AROM;Both;10 reps;Seated Mini-Sqauts: 5 reps    General Comments General comments (skin integrity, edema, etc.): Supine: 114/91, Sitting: 110/86, Stand: 102/83      Pertinent Vitals/Pain Pain Assessment: No/denies pain    Home Living                      Prior Function            PT Goals (current goals can now be found in the care plan section) Acute Rehab PT Goals Patient Stated Goal: get better PT Goal Formulation: With patient Time For Goal Achievement: 04/13/21 Potential to Achieve Goals: Good Progress towards PT goals: Progressing toward goals    Frequency    Min 4X/week      PT Plan Current plan remains appropriate    Co-evaluation              AM-PAC PT "6 Clicks" Mobility   Outcome Measure  Help needed turning from your back to your side while in a flat bed without using bedrails?: A Little Help needed moving from lying on your back to sitting on the side of a flat bed without using bedrails?: A Little Help needed moving to and from a bed to a chair (including a wheelchair)?: Total Help needed standing up from a chair using your arms (e.g., wheelchair or bedside chair)?: Total Help needed to walk in hospital room?: Total Help needed climbing 3-5 steps with a railing? : Total 6 Click Score: 10    End of Session Equipment Utilized During Treatment: Gait belt Activity Tolerance: Patient tolerated treatment well Patient left: in bed;with call bell/phone within reach;with bed alarm set Nurse Communication: Mobility status PT Visit Diagnosis: Unsteadiness on feet (R26.81);Muscle weakness (generalized) (M62.81);Other abnormalities of gait and mobility (R26.89)      Time: 1431-1455 PT Time Calculation (min) (ACUTE ONLY): 24 min  Charges:  $Gait Training: 8-22 mins $Therapeutic Exercise: 8-22 mins                     Laquitta Dominski A. Dan Humphreys PT, DPT Acute Rehabilitation Services Pager [phone] Office [phone]    Viviann Spare 03/31/2021, 5:00 PM

## 2021-03-31 NOTE — Progress Notes (Signed)
PROGRESS NOTE        PATIENT DETAILS Name: Ricky Gonzalez Age: 50 y.o. Sex: male Date of Birth: 05-31-1971 Admit Date: 03/29/2021 Admitting Physician Rejeana Brock, MD PCP:Pcp, No  Brief Narrative: Patient is a 50 y.o. 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  Significant events: 6/27>> admit to neuro ICU for left thalamic intraparenchymal hemorrhage. 6/28>> transfer to [facility]  Significant studies: 6/27>> CT head: 4.5 mm acute intraparenchymal left thalamocapsular intraparenchymal hemorrhage 6/27>> CTA head/neck: No LVO, no high-grade stenosis, no aneurysm or vascular abnormality. 6/27>> MRI brain: Acute parenchymal hemorrhage within the left thalamocapsular junction and corona radiata 6/27>> TTE: EF 65-70%, grade 1 diastolic dysfunction. 6/27>> UDS: Negative 6/27>> A1c: 4.7 6/27>> LDL: 77  Antimicrobial therapy: None  Microbiology data: 6/27>> influenza/COVID PCR: Negative  Procedures : None  Consults: Neurology  DVT Prophylaxis : SCD's Start: 03/29/21 0533   Subjective: Lying comfortably in bed having very minimal tremors.  Assessment/Plan: Left thalamic intraparenchymal hemorrhage: Suspected hypertensive etiology-no aneurysm/vascular abnormality seen on CT angiogram.  No obvious focal deficits on exam-appreciate PT/OT input-CIR plan on discharge.   HTN: Noncompliant-currently BP stable with amlodipine, labetalol  Alcohol withdrawal: Some mild tremors-continue Ativan per CIWA protocol.  On MVI/thiamine as well.  Counseled regarding importance of complete abstinence from EtOH use.  Tobacco use: We will need counseling going forward.  Diet: Diet Order             Diet Heart Room service appropriate? Yes; Fluid consistency: Thin  Diet effective now                    Code  Status: Full code   Family Communication: None at bedside  Disposition Plan: Status is: Inpatient  Remains inpatient appropriate because:Inpatient level of care appropriate due to severity of illness  Dispo: The patient is from: Home              Anticipated d/c is to: CIR              Patient currently is not medically stable to d/c.   Difficult to place patient No    Barriers to Discharge: Awaiting CIR bed/insurance authorization.  Antimicrobial agents: Anti-infectives (From admission, onward)    None        Time spent: 15 minutes-Greater than 50% of this time was spent in counseling, explanation of diagnosis, planning of further management, and coordination of care.  MEDICATIONS: Scheduled Meds:  amLODipine  10 mg Oral Daily   atorvastatin  20 mg Oral Daily   Chlorhexidine Gluconate Cloth  6 each Topical Daily   folic acid  1 mg Oral Daily   labetalol  200 mg Oral BID   LORazepam  0-4 mg Oral Q8H   multivitamin with minerals  1 tablet Oral Daily   pantoprazole  40 mg Oral Daily   senna-docusate  1 tablet Oral BID   thiamine  100 mg Oral Daily   Or   thiamine  100 mg Intravenous Daily   Continuous Infusions: PRN Meds:.acetaminophen **OR** acetaminophen (TYLENOL) oral liquid 160 mg/5 mL **OR** acetaminophen, labetalol, LORazepam **OR** [DISCONTINUED] LORazepam   PHYSICAL EXAM: Vital signs: Vitals:   03/31/21 0018 03/31/21 0402 03/31/21 0817 03/31/21 1137  BP: 140/89 (!) 153/98 Marland Kitchen)  136/92 (!) 114/93  Pulse: 76 79 70 77  Resp: 20 20 16 16   Temp: 98.4 F (36.9 C) 98.5 F (36.9 C) 98.6 F (37 C) 98.4 F (36.9 C)  TempSrc:  Oral Oral Oral  SpO2: 98% 100% 99% 99%  Weight:      Height:       Filed Weights   03/29/21 0028  Weight: 65.8 kg   Body mass index is 21.41 kg/m.   Gen Exam:Alert awake-not in any distress HEENT:atraumatic, normocephalic Chest: B/L clear to auscultation anteriorly CVS:S1S2 regular Abdomen:soft non tender, non  distended Extremities:no edema Neurology: Non focal Skin: no rash  I have personally reviewed following labs and imaging studies  LABORATORY DATA: CBC: Recent Labs  Lab 03/29/21 0030 03/29/21 0702  WBC 5.0 5.8  NEUTROABS 1.3*  --   HGB 14.2 14.7  HCT 41.7 43.0  MCV 84.9 85.1  PLT 212 204     Basic Metabolic Panel: Recent Labs  Lab 03/29/21 0030 03/29/21 0702 03/31/21 0332  NA 138 137 136  K 3.5 3.5 3.4*  CL 98 98 101  CO2 28 24 26   GLUCOSE 97 84 93  BUN 13 9 6   CREATININE 1.09 0.89 1.17  CALCIUM 9.0 9.3 9.3  MG  --  1.9 2.0  PHOS  --  2.6  --      GFR: Estimated Creatinine Clearance: 71.1 mL/min (by C-G formula based on SCr of 1.17 mg/dL).  Liver Function Tests: Recent Labs  Lab 03/29/21 0030 03/29/21 0702 03/31/21 0332  AST 57* 43* 39  ALT 44 43 31  ALKPHOS 60 60 53  BILITOT 1.2 1.4* 1.6*  PROT 7.6 7.1 6.5  ALBUMIN 4.5 4.2 3.8    No results for input(s): LIPASE, AMYLASE in the last 168 hours. No results for input(s): AMMONIA in the last 168 hours.  Coagulation Profile: Recent Labs  Lab 03/29/21 0030  INR 1.0     Cardiac Enzymes: No results for input(s): CKTOTAL, CKMB, CKMBINDEX, TROPONINI in the last 168 hours.  BNP (last 3 results) No results for input(s): PROBNP in the last 8760 hours.  Lipid Profile: Recent Labs    03/29/21 0702  CHOL 248*  HDL 118  LDLCALC 77  TRIG 04/02/21*  CHOLHDL 2.1     Thyroid Function Tests: No results for input(s): TSH, T4TOTAL, FREET4, T3FREE, THYROIDAB in the last 72 hours.  Anemia Panel: No results for input(s): VITAMINB12, FOLATE, FERRITIN, TIBC, IRON, RETICCTPCT in the last 72 hours.  Urine analysis:    Component Value Date/Time   COLORURINE YELLOW 03/29/2021 0220   APPEARANCEUR CLEAR 03/29/2021 0220   LABSPEC 1.010 03/29/2021 0220   PHURINE 7.0 03/29/2021 0220   GLUCOSEU NEGATIVE 03/29/2021 0220   HGBUR NEGATIVE 03/29/2021 0220   BILIRUBINUR NEGATIVE 03/29/2021 0220   KETONESUR  NEGATIVE 03/29/2021 0220   PROTEINUR NEGATIVE 03/29/2021 0220   NITRITE NEGATIVE 03/29/2021 0220   LEUKOCYTESUR NEGATIVE 03/29/2021 0220    Sepsis Labs: Lactic Acid, Venous No results found for: LATICACIDVEN  MICROBIOLOGY: Recent Results (from the past 240 hour(s))  Resp Panel by RT-PCR (Flu A&B, Covid) Nasopharyngeal Swab     Status: None   Collection Time: 03/29/21 12:30 AM   Specimen: Nasopharyngeal Swab; Nasopharyngeal(NP) swabs in vial transport medium  Result Value Ref Range Status   SARS Coronavirus 2 by RT PCR NEGATIVE NEGATIVE Final    Comment: (NOTE) SARS-CoV-2 target nucleic acids are NOT DETECTED.  The SARS-CoV-2 RNA is generally detectable in upper respiratory specimens during  the acute phase of infection. The lowest concentration of SARS-CoV-2 viral copies this assay can detect is 138 copies/mL. A negative result does not preclude SARS-Cov-2 infection and should not be used as the sole basis for treatment or other patient management decisions. A negative result may occur with  improper specimen collection/handling, submission of specimen other than nasopharyngeal swab, presence of viral mutation(s) within the areas targeted by this assay, and inadequate number of viral copies(<138 copies/mL). A negative result must be combined with clinical observations, patient history, and epidemiological information. The expected result is Negative.  Fact Sheet for Patients:  BloggerCourse.com  Fact Sheet for Healthcare Providers:  SeriousBroker.it  This test is no t yet approved or cleared by the [place] FDA and  has been authorized for detection and/or diagnosis of SARS-CoV-2 by FDA under an Emergency Use Authorization (EUA). This EUA will remain  in effect (meaning this test can be used) for the duration of the COVID-19 declaration under Section 564(b)(1) of the Act, 21 U.S.C.section 360bbb-3(b)(1), unless the  authorization is terminated  or revoked sooner.       Influenza A by PCR NEGATIVE NEGATIVE Final   Influenza B by PCR NEGATIVE NEGATIVE Final    Comment: (NOTE) The Xpert Xpress SARS-CoV-2/FLU/RSV plus assay is intended as an aid in the diagnosis of influenza from Nasopharyngeal swab specimens and should not be used as a sole basis for treatment. Nasal washings and aspirates are unacceptable for Xpert Xpress SARS-CoV-2/FLU/RSV testing.  Fact Sheet for Patients: BloggerCourse.com  Fact Sheet for Healthcare Providers: SeriousBroker.it  This test is not yet approved or cleared by the [place] FDA and has been authorized for detection and/or diagnosis of SARS-CoV-2 by FDA under an Emergency Use Authorization (EUA). This EUA will remain in effect (meaning this test can be used) for the duration of the COVID-19 declaration under Section 564(b)(1) of the Act, 21 U.S.C. section 360bbb-3(b)(1), unless the authorization is terminated or revoked.  Performed at [facility], [address] [place], [place] [address]   Surgical pcr screen     Status: None   Collection Time: 03/29/21  5:37 AM   Specimen: Nasal Mucosa; Nasal Swab  Result Value Ref Range Status   MRSA, PCR NEGATIVE NEGATIVE Final   Staphylococcus aureus NEGATIVE NEGATIVE Final    Comment: (NOTE) The Xpert SA Assay (FDA approved for NASAL specimens in patients 110 years of age and older), is one component of a comprehensive surveillance program. It is not intended to diagnose infection nor to guide or monitor treatment. Performed at [facility], [address], [place], [place] [address]     RADIOLOGY STUDIES/RESULTS: No results found.   LOS: 2 days   Jeoffrey Massed, MD  [facility]    To contact the attending provider between 7A-7P or the covering provider during after hours 7P-7A, please log into the web site [email or web address] and  access using universal [place] password for that web site. If you do not have the password, please call the hospital operator.  03/31/2021, 2:18 PM

## 2021-03-31 NOTE — TOC Initial Note (Signed)
Transition of Care [facility]) - Initial/Assessment Note    Patient Details  Name: Ricky Gonzalez MRN: [id number] Date of Birth: January 28, 1971  [facility]) CM/SW Contact:    Pollie Friar, RN Phone Number: 03/31/2021, 1:24 PM  Clinical Narrative:                 Recommendation from therapy is CIR. Per CIR they are not in network with the patients insurance. CM met with the patient and inquired about [facility] versus the 2 rehabs in [facility]. Patient asked that his information be sent to [facility]. CM faxed his information to [facility].  This PM the patients wife is at the bedside and CM went over the same information with her. She asked that his information also be sent to [facility] as pt has been there before and it was a great experience. CM has called Sharyn Lull at [place] and faxed her the information. TOC following.   Expected Discharge Plan: IP Rehab Facility Barriers to Discharge: Continued Medical Work up   Patient Goals and CMS Choice   CMS Medicare.gov Compare Post Acute Care list provided to:: Patient Choice offered to / list presented to : Patient, Spouse  Expected Discharge Plan and Services Expected Discharge Plan: [place]   Discharge Planning Services: CM Consult Post Acute Care Choice: IP Rehab Living arrangements for the past 2 months: Single Family Home                                      Prior Living Arrangements/Services Living arrangements for the past 2 months: Single Family Home Lives with:: Spouse Patient language and need for interpreter reviewed:: Yes Do you feel safe going back to the place where you live?: Yes      Need for Family Participation in Patient Care: Yes (Comment) Care giver support system in place?: Yes (comment)   Criminal Activity/Legal Involvement Pertinent to Current Situation/Hospitalization: No - Comment as needed  Activities of Daily Living Home Assistive Devices/Equipment: None ADL Screening (condition at  time of admission) Patient's cognitive ability adequate to safely complete daily activities?: Yes Is the patient deaf or have difficulty hearing?: No Does the patient have difficulty seeing, even when wearing glasses/contacts?: No Does the patient have difficulty concentrating, remembering, or making decisions?: No Patient able to express need for assistance with ADLs?: Yes Does the patient have difficulty dressing or bathing?: No Independently performs ADLs?: Yes (appropriate for developmental age) Does the patient have difficulty walking or climbing stairs?: No Weakness of Legs: None Weakness of Arms/Hands: None  Permission Sought/Granted                  Emotional Assessment Appearance:: Appears stated age   Affect (typically observed): Accepting Orientation: : Oriented to Self, Oriented to Place, Oriented to  Time, Oriented to Situation Alcohol / Substance Use: Alcohol Use Psych Involvement: No (comment)  Admission diagnosis:  Primary hypertension [I10] Thalamic hemorrhage (HCC) [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.0] Stroke (cerebrum) (HCC) [I63.9] Noncompliance [Z91.19] Cerebrovascular accident (CVA), unspecified mechanism ([place]) [I63.9] Patient Active Problem List   Diagnosis Date Noted   Stroke (cerebrum) ([place]) 03/29/2021   Primary hypertension    Thalamic hemorrhage ([place])    Hypertensive emergency    Smoker    Alcohol abuse    History of stroke    PCP:  Pcp, No Pharmacy:   [id number] - [place], [place] -  [address] AT [address] [place] [address] Phone: [phone] Fax: [phone]     Social Determinants of Health ([place]) Interventions    Readmission Risk Interventions No flowsheet data found.

## 2021-03-31 NOTE — Progress Notes (Signed)
Patient removed his IVs.  Ricky Gonzalez he did not think he needed them and did not want them in.  He does not have scheduled IV meds but has CIWA scale ordered and has IV Lorazepam prn.  He has never needed it and has been here 2.5 days.  His highest CIWA score was 4 and that was a few days ago.  He is alert and oriented.  I did page on call MD for [facility] and left a message.  He has no distress.

## 2021-03-31 NOTE — Plan of Care (Signed)
  Problem: Education: Goal: Knowledge of General Education information will improve Description: Including pain rating scale, medication(s)/side effects and non-pharmacologic comfort measures Outcome: Progressing   Problem: Health Behavior/Discharge Planning: Goal: Ability to manage health-related needs will improve Outcome: Progressing   Problem: Clinical Measurements: Goal: Ability to maintain clinical measurements within normal limits will improve Outcome: Progressing Goal: Will remain free from infection Outcome: Progressing Goal: Diagnostic test results will improve Outcome: Progressing Goal: Respiratory complications will improve Outcome: Progressing Goal: Cardiovascular complication will be avoided Outcome: Progressing   Problem: Activity: Goal: Risk for activity intolerance will decrease Outcome: Progressing   Problem: Nutrition: Goal: Adequate nutrition will be maintained Outcome: Progressing   Problem: Coping: Goal: Level of anxiety will decrease Outcome: Progressing   Problem: Elimination: Goal: Will not experience complications related to bowel motility Outcome: Progressing Goal: Will not experience complications related to urinary retention Outcome: Progressing   Problem: Pain Managment: Goal: General experience of comfort will improve Outcome: Progressing   Problem: Safety: Goal: Ability to remain free from injury will improve Outcome: Progressing   Problem: Skin Integrity: Goal: Risk for impaired skin integrity will decrease Outcome: Progressing   Problem: Education: Goal: Knowledge of disease or condition will improve Outcome: Progressing Goal: Knowledge of secondary prevention will improve Outcome: Progressing Goal: Knowledge of patient specific risk factors addressed and post discharge goals established will improve Outcome: Progressing   Problem: Coping: Goal: Will verbalize positive feelings about self Outcome: Progressing   Problem:  Self-Care: Goal: Ability to participate in self-care as condition permits will improve Outcome: Progressing   Problem: Intracerebral Hemorrhage Tissue Perfusion: Goal: Complications of Intracerebral Hemorrhage will be minimized Outcome: Progressing   

## 2021-04-01 LAB — BASIC METABOLIC PANEL
Anion gap: 10 (ref 5–15)
BUN: 8 mg/dL (ref 6–20)
CO2: 24 mmol/L (ref 22–32)
Calcium: 9.3 mg/dL (ref 8.9–10.3)
Chloride: 99 mmol/L (ref 98–111)
Creatinine, Ser: 1.09 mg/dL (ref 0.61–1.24)
GFR, Estimated: >60 mL/min (ref >60)
Glucose, Bld: 92 mg/dL (ref 70–99)
Potassium: 3.6 mmol/L (ref 3.5–5.1)
Sodium: 133 mmol/L — ABNORMAL LOW (ref 135–145)

## 2021-04-01 NOTE — Plan of Care (Signed)
  Problem: Education: Goal: Knowledge of General Education information will improve Description: Including pain rating scale, medication(s)/side effects and non-pharmacologic comfort measures Outcome: Progressing   Problem: Health Behavior/Discharge Planning: Goal: Ability to manage health-related needs will improve Outcome: Progressing   Problem: Clinical Measurements: Goal: Ability to maintain clinical measurements within normal limits will improve Outcome: Progressing Goal: Will remain free from infection Outcome: Progressing Goal: Diagnostic test results will improve Outcome: Progressing Goal: Respiratory complications will improve Outcome: Progressing Goal: Cardiovascular complication will be avoided Outcome: Progressing   Problem: Activity: Goal: Risk for activity intolerance will decrease Outcome: Progressing   Problem: Nutrition: Goal: Adequate nutrition will be maintained Outcome: Progressing   Problem: Coping: Goal: Level of anxiety will decrease Outcome: Progressing   Problem: Elimination: Goal: Will not experience complications related to bowel motility Outcome: Progressing Goal: Will not experience complications related to urinary retention Outcome: Progressing   Problem: Pain Managment: Goal: General experience of comfort will improve Outcome: Progressing   Problem: Safety: Goal: Ability to remain free from injury will improve Outcome: Progressing   Problem: Skin Integrity: Goal: Risk for impaired skin integrity will decrease Outcome: Progressing   Problem: Education: Goal: Knowledge of disease or condition will improve Outcome: Progressing Goal: Knowledge of secondary prevention will improve Outcome: Progressing Goal: Knowledge of patient specific risk factors addressed and post discharge goals established will improve Outcome: Progressing   Problem: Coping: Goal: Will verbalize positive feelings about self Outcome: Progressing   Problem:  Self-Care: Goal: Ability to participate in self-care as condition permits will improve Outcome: Progressing   Problem: Intracerebral Hemorrhage Tissue Perfusion: Goal: Complications of Intracerebral Hemorrhage will be minimized Outcome: Progressing

## 2021-04-01 NOTE — TOC Progression Note (Signed)
Transition of Care [facility]) - Progression Note    Patient Details  Name: Ricky Gonzalez MRN: [id number] Date of Birth: 1971/02/16  Transition of Care [facility]) CM/SW Contact  Kermit Balo, RN Phone Number: 04/01/2021, 1:29 PM  Clinical Narrative:    Pt and wife have selected [facility] for IR. Smitty Cords has started insurance auth.  TOC following.   Expected Discharge Plan: IP Rehab Facility Barriers to Discharge: Continued Medical Work up  Expected Discharge Plan and Services Expected Discharge Plan: IP Rehab Facility   Discharge Planning Services: CM Consult Post Acute Care Choice: IP Rehab Living arrangements for the past 2 months: Single Family Home                                       Social Determinants of Health (SDOH) Interventions    Readmission Risk Interventions No flowsheet data found.

## 2021-04-01 NOTE — Progress Notes (Signed)
Physical Therapy Treatment Patient Details Name: Ricky Gonzalez MRN: [id number] DOB: Dec 19, 1970 Today's Date: 04/01/2021    History of Present Illness 50 y.o male presenting to ED with R sided numbness and weakness. 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    PT Comments    Upon arrival, educated patient on need for 2 people and use of gait belt for safe ambulation due to fall with nursing staff earlier this date with one person present. Patient required modA+2 for 20' ambulation with RW for balance, RW management, and safety. Patient required frequent cueing for attending to R side and using vision for assistance on LE placement. Educated patient on continued use of R UE for functional tasks to improve coordination. Continue to recommend comprehensive inpatient rehab (CIR) for post-acute therapy needs.     Follow Up Recommendations  CIR     Equipment Recommendations  Other (comment) (TBD)    Recommendations for Other Services       Precautions / Restrictions Precautions Precautions: Fall Precaution Comments: ataxia Restrictions Weight Bearing Restrictions: No    Mobility  Bed Mobility Overal bed mobility: Needs Assistance Bed Mobility: Supine to Sit;Sit to Supine     Supine to sit: Min guard Sit to supine: Min guard   General bed mobility comments: min guard for safety due to ataxic and uncontrolled movements towards EOB    Transfers Overall transfer level: Needs assistance Equipment used: Rolling Ricky Gonzalez (2 wheeled) Transfers: Sit to/from Stand Sit to Stand: Min assist;+2 physical assistance;+2 safety/equipment         General transfer comment: minA+2 to rise and steady. Repeated sit to stands x 10 with focus on attending to R side primarily R UE on RW as he stands. Improved control with  repeated movements  Ambulation/Gait Ambulation/Gait assistance: Mod assist;+2 physical assistance;+2 safety/equipment Gait Distance (Feet): 20 Feet Assistive device: Rolling Ricky Gonzalez (2 wheeled) Gait Pattern/deviations: Step-to pattern;Decreased stride length;Ataxic Gait velocity: decreased   General Gait Details: varying BOS requiring modA+2 for balance and RW management. Improved coordination with use of vision as tool but required cues for attending to R UE on RW. Steppage gait pattern noted   Stairs             Wheelchair Mobility    Modified Rankin (Stroke Patients Only) Modified Rankin (Stroke Patients Only) Pre-Morbid Rankin Score: Slight disability Modified Rankin: Moderately severe disability     Balance Overall balance assessment: Needs assistance Sitting-balance support: No upper extremity supported;Feet supported Sitting balance-Leahy Scale: Fair Sitting balance - Comments: close supervision sitting EOB   Standing balance support: Bilateral upper extremity supported;During functional activity Standing balance-Leahy Scale: Poor Standing balance comment: reliant on UE support and external assist                            Cognition Arousal/Alertness: Awake/alert Behavior During Therapy: WFL for tasks assessed/performed Overall Cognitive Status: Impaired/Different from baseline Area of Impairment: Attention;Memory;Safety/judgement;Following commands;Awareness;Problem solving                   Current Attention Level: Sustained Memory: Decreased short-term memory Following Commands: Follows one step commands consistently;Follows one step commands with increased time Safety/Judgement: Decreased awareness of safety;Decreased awareness of deficits Awareness: Intellectual;Emergent Problem Solving: Slow processing;Requires verbal cues General Comments: Decreased attention to R side required cues  to attend during mobility.      Exercises       General Comments        Pertinent Vitals/Pain Pain Assessment: No/denies pain    Home Living                      Prior Function            PT Goals (current goals can now be found in the care plan section) Acute Rehab PT Goals Patient Stated Goal: get better PT Goal Formulation: With patient Time For Goal Achievement: 04/13/21 Potential to Achieve Goals: Good Progress towards PT goals: Progressing toward goals    Frequency    Min 4X/week      PT Plan Current plan remains appropriate    Co-evaluation              AM-PAC PT "6 Clicks" Mobility   Outcome Measure  Help needed turning from your back to your side while in a flat bed without using bedrails?: A Little Help needed moving from lying on your back to sitting on the side of a flat bed without using bedrails?: A Little Help needed moving to and from a bed to a chair (including a wheelchair)?: Total Help needed standing up from a chair using your arms (e.g., wheelchair or bedside chair)?: Total Help needed to walk in hospital room?: Total Help needed climbing 3-5 steps with a railing? : Total 6 Click Score: 10    End of Session Equipment Utilized During Treatment: Gait belt Activity Tolerance: Patient tolerated treatment well Patient left: in bed;with call bell/phone within reach;with bed alarm set Nurse Communication: Mobility status PT Visit Diagnosis: Unsteadiness on feet (R26.81);Muscle weakness (generalized) (M62.81);Other abnormalities of gait and mobility (R26.89)     Time: 0932-3557 PT Time Calculation (min) (ACUTE ONLY): 26 min  Charges:  $Gait Training: 8-22 mins $Therapeutic Exercise: 8-22 mins                     Ricky Gonzalez A. Dan Humphreys PT, DPT Acute Rehabilitation Services Pager [phone] Office [phone]    Viviann Spare 04/01/2021, 4:55 PM

## 2021-04-01 NOTE — Progress Notes (Signed)
PROGRESS NOTE        PATIENT DETAILS Name: Ricky Gonzalez Age: 50 y.o. Sex: male Date of Birth: Jul 11, 1971 Admit Date: 03/29/2021 Admitting Physician Rejeana Brock, MD PCP:Pcp, No  Brief Narrative: Patient is a 50 y.o. 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  Significant events: 6/27>> admit to neuro ICU for left thalamic intraparenchymal hemorrhage. 6/28>> transfer to [facility]  Significant studies: 6/27>> CT head: 4.5 mm acute intraparenchymal left thalamocapsular intraparenchymal hemorrhage 6/27>> CTA head/neck: No LVO, no high-grade stenosis, no aneurysm or vascular abnormality. 6/27>> MRI brain: Acute parenchymal hemorrhage within the left thalamocapsular junction and corona radiata 6/27>> TTE: EF 65-70%, grade 1 diastolic dysfunction. 6/27>> UDS: Negative 6/27>> A1c: 4.7 6/27>> LDL: 77  Antimicrobial therapy: None  Microbiology data: 6/27>> influenza/COVID PCR: Negative  Procedures : None  Consults: Neurology  DVT Prophylaxis : SCD's Start: 03/29/21 0533   Subjective: Patient in bed, appears comfortable, denies any headache, no fever, no chest pain or pressure, no shortness of breath , no abdominal pain. No new focal weakness.   Assessment/Plan: Left thalamic intraparenchymal hemorrhage: Suspected hypertensive etiology-no aneurysm/vascular abnormality seen on CT angiogram.  No obvious focal deficits on exam-appreciate PT/OT input-CIR plan on discharge.   HTN: Noncompliant-currently BP stable with amlodipine, labetalol  Alcohol withdrawal: Some mild tremors-continue Ativan per CIWA protocol.  On MVI/thiamine as well.  Counseled regarding importance of complete abstinence from EtOH use.  Tobacco use: We will need counseling going forward.  Diet: Diet Order             Diet Heart Room  service appropriate? Yes; Fluid consistency: Thin  Diet effective now                    Code Status: Full code   Family Communication: None at bedside  Disposition Plan: Status is: Inpatient  Remains inpatient appropriate because:Inpatient level of care appropriate due to severity of illness  Dispo: The patient is from: Home              Anticipated d/c is to: CIR              Patient currently is not medically stable to d/c.   Difficult to place patient No    Barriers to Discharge: Awaiting CIR bed/insurance authorization.  Antimicrobial agents: Anti-infectives (From admission, onward)    None        Time spent: 15 minutes-Greater than 50% of this time was spent in counseling, explanation of diagnosis, planning of further management, and coordination of care.  MEDICATIONS: Scheduled Meds:  amLODipine  10 mg Oral Daily   atorvastatin  20 mg Oral Daily   Chlorhexidine Gluconate Cloth  6 each Topical Daily   folic acid  1 mg Oral Daily   labetalol  200 mg Oral BID   LORazepam  0-4 mg Oral Q8H   multivitamin with minerals  1 tablet Oral Daily   pantoprazole  40 mg Oral Daily   senna-docusate  1 tablet Oral BID   thiamine  100 mg Oral Daily   Or   thiamine  100 mg Intravenous Daily   Continuous Infusions: PRN Meds:.acetaminophen **OR** acetaminophen (TYLENOL) oral liquid 160 mg/5 mL **OR** acetaminophen, labetalol   PHYSICAL EXAM: Vital signs: Vitals:  03/31/21 2347 04/01/21 0335 04/01/21 0759 04/01/21 0800  BP: (!) 142/98 (!) 154/97 135/86 135/86  Pulse: 74 71 72 72  Resp: 17 17  16   Temp: 98.5 F (36.9 C) 97.8 F (36.6 C)  98.2 F (36.8 C)  TempSrc: Oral   Oral  SpO2: 98% 100%  100%  Weight:      Height:       Filed Weights   03/29/21 0028  Weight: 65.8 kg   Body mass index is 21.41 kg/m.   Gen Exam:  Awake Alert, No new F.N deficits, mild R. Sided 4/5 weakness arm >> leg [place].AT,PERRAL Supple Neck,No JVD, No cervical lymphadenopathy  appriciated.  Symmetrical Chest wall movement, Good air movement bilaterally, CTAB RRR,No Gallops, Rubs or new Murmurs, No Parasternal Heave +ve B.Sounds, Abd Soft, No tenderness, No organomegaly appriciated, No rebound - guarding or rigidity. No Cyanosis, Clubbing or edema, No new Rash or bruise   I have personally reviewed following labs and imaging studies  LABORATORY DATA: CBC: Recent Labs  Lab 03/29/21 0030 03/29/21 0702  WBC 5.0 5.8  NEUTROABS 1.3*  --   HGB 14.2 14.7  HCT 41.7 43.0  MCV 84.9 85.1  PLT 212 204    Basic Metabolic Panel: Recent Labs  Lab 03/29/21 0030 03/29/21 0702 03/31/21 0332 04/01/21 0436  NA 138 137 136 133*  K 3.5 3.5 3.4* 3.6  CL 98 98 101 99  CO2 28 24 26 24   GLUCOSE 97 84 93 92  BUN 13 9 6 8   CREATININE 1.09 0.89 1.17 1.09  CALCIUM 9.0 9.3 9.3 9.3  MG  --  1.9 2.0  --   PHOS  --  2.6  --   --     GFR: Estimated Creatinine Clearance: 76.3 mL/min (by C-G formula based on SCr of 1.09 mg/dL).  Liver Function Tests: Recent Labs  Lab 03/29/21 0030 03/29/21 0702 03/31/21 0332  AST 57* 43* 39  ALT 44 43 31  ALKPHOS 60 60 53  BILITOT 1.2 1.4* 1.6*  PROT 7.6 7.1 6.5  ALBUMIN 4.5 4.2 3.8   No results for input(s): LIPASE, AMYLASE in the last 168 hours. No results for input(s): AMMONIA in the last 168 hours.  Coagulation Profile: Recent Labs  Lab 03/29/21 0030  INR 1.0    Cardiac Enzymes: No results for input(s): CKTOTAL, CKMB, CKMBINDEX, TROPONINI in the last 168 hours.  BNP (last 3 results) No results for input(s): PROBNP in the last 8760 hours.  Lipid Profile: No results for input(s): CHOL, HDL, LDLCALC, TRIG, CHOLHDL, LDLDIRECT in the last 72 hours.  Thyroid Function Tests: No results for input(s): TSH, T4TOTAL, FREET4, T3FREE, THYROIDAB in the last 72 hours.  Anemia Panel: No results for input(s): VITAMINB12, FOLATE, FERRITIN, TIBC, IRON, RETICCTPCT in the last 72 hours.  Urine analysis:    Component Value  Date/Time   COLORURINE YELLOW 03/29/2021 0220   APPEARANCEUR CLEAR 03/29/2021 0220   LABSPEC 1.010 03/29/2021 0220   PHURINE 7.0 03/29/2021 0220   GLUCOSEU NEGATIVE 03/29/2021 0220   HGBUR NEGATIVE 03/29/2021 0220   BILIRUBINUR NEGATIVE 03/29/2021 0220   KETONESUR NEGATIVE 03/29/2021 0220   PROTEINUR NEGATIVE 03/29/2021 0220   NITRITE NEGATIVE 03/29/2021 0220   LEUKOCYTESUR NEGATIVE 03/29/2021 0220    Sepsis Labs: Lactic Acid, Venous No results found for: LATICACIDVEN  MICROBIOLOGY: Recent Results (from the past 240 hour(s))  Resp Panel by RT-PCR (Flu A&B, Covid) Nasopharyngeal Swab     Status: None   Collection Time: 03/29/21 12:30  AM   Specimen: Nasopharyngeal Swab; Nasopharyngeal(NP) swabs in vial transport medium  Result Value Ref Range Status   SARS Coronavirus 2 by RT PCR NEGATIVE NEGATIVE Final    Comment: (NOTE) SARS-CoV-2 target nucleic acids are NOT DETECTED.  The SARS-CoV-2 RNA is generally detectable in upper respiratory specimens during the acute phase of infection. The lowest concentration of SARS-CoV-2 viral copies this assay can detect is 138 copies/mL. A negative result does not preclude SARS-Cov-2 infection and should not be used as the sole basis for treatment or other patient management decisions. A negative result may occur with  improper specimen collection/handling, submission of specimen other than nasopharyngeal swab, presence of viral mutation(s) within the areas targeted by this assay, and inadequate number of viral copies(<138 copies/mL). A negative result must be combined with clinical observations, patient history, and epidemiological information. The expected result is Negative.  Fact Sheet for Patients:  BloggerCourse.com  Fact Sheet for Healthcare Providers:  SeriousBroker.it  This test is no t yet approved or cleared by the [place] FDA and  has been authorized for detection and/or  diagnosis of SARS-CoV-2 by FDA under an Emergency Use Authorization (EUA). This EUA will remain  in effect (meaning this test can be used) for the duration of the COVID-19 declaration under Section 564(b)(1) of the Act, 21 U.S.C.section 360bbb-3(b)(1), unless the authorization is terminated  or revoked sooner.       Influenza A by PCR NEGATIVE NEGATIVE Final   Influenza B by PCR NEGATIVE NEGATIVE Final    Comment: (NOTE) The Xpert Xpress SARS-CoV-2/FLU/RSV plus assay is intended as an aid in the diagnosis of influenza from Nasopharyngeal swab specimens and should not be used as a sole basis for treatment. Nasal washings and aspirates are unacceptable for Xpert Xpress SARS-CoV-2/FLU/RSV testing.  Fact Sheet for Patients: BloggerCourse.com  Fact Sheet for Healthcare Providers: SeriousBroker.it  This test is not yet approved or cleared by the [place] FDA and has been authorized for detection and/or diagnosis of SARS-CoV-2 by FDA under an Emergency Use Authorization (EUA). This EUA will remain in effect (meaning this test can be used) for the duration of the COVID-19 declaration under Section 564(b)(1) of the Act, 21 U.S.C. section 360bbb-3(b)(1), unless the authorization is terminated or revoked.  Performed at [facility], [address], [place], [place] [address]   Surgical pcr screen     Status: None   Collection Time: 03/29/21  5:37 AM   Specimen: Nasal Mucosa; Nasal Swab  Result Value Ref Range Status   MRSA, PCR NEGATIVE NEGATIVE Final   Staphylococcus aureus NEGATIVE NEGATIVE Final    Comment: (NOTE) The Xpert SA Assay (FDA approved for NASAL specimens in patients 94 years of age and older), is one component of a comprehensive surveillance program. It is not intended to diagnose infection nor to guide or monitor treatment. Performed at [facility], [address] [place], [place] [address]      RADIOLOGY STUDIES/RESULTS: No results found.   LOS: 3 days   Signature  Susa Raring M.D on 04/01/2021 at 11:23 AM   -  To page go to [email or web address]

## 2021-04-02 MED ORDER — THIAMINE HCL 100 MG PO TABS: 100.0000 mg | ORAL_TABLET | Freq: Every day | ORAL | Status: AC

## 2021-04-02 MED ORDER — ATORVASTATIN CALCIUM 20 MG PO TABS: 20.0000 mg | ORAL_TABLET | Freq: Every day | ORAL | Status: AC

## 2021-04-02 MED ORDER — AMLODIPINE BESYLATE 10 MG PO TABS: 10.0000 mg | ORAL_TABLET | Freq: Every day | ORAL | Status: AC

## 2021-04-02 MED ORDER — PANTOPRAZOLE SODIUM 40 MG PO TBEC: 40.0000 mg | DELAYED_RELEASE_TABLET | Freq: Every day | ORAL | Status: AC

## 2021-04-02 MED ORDER — FOLIC ACID 1 MG PO TABS: 1.0000 mg | ORAL_TABLET | Freq: Every day | ORAL | Status: AC

## 2021-04-02 MED ORDER — LABETALOL HCL 200 MG PO TABS: 200.0000 mg | ORAL_TABLET | Freq: Two times a day (BID) | ORAL | Status: AC

## 2021-04-02 NOTE — Discharge Instructions (Signed)
Follow with Primary MD Pcp, No in 7 days   Get CBC, CMP, 2 view Chest X ray -  checked next visit within 1 week by CIR MD or PCP  Activity: As tolerated with Full fall precautions use walker/cane & assistance as needed  Disposition CIR  Diet: Heart Healthy    Special Instructions: If you have smoked or chewed Tobacco  in the last 2 yrs please stop smoking, stop any regular Alcohol  and or any Recreational drug use.  On your next visit with your primary care physician please Get Medicines reviewed and adjusted.  Please request your Prim.MD to go over all Hospital Tests and Procedure/Radiological results at the follow up, please get all Hospital records sent to your Prim MD by signing hospital release before you go home.  If you experience worsening of your admission symptoms, develop shortness of breath, life threatening emergency, suicidal or homicidal thoughts you must seek medical attention immediately by calling 911 or calling your MD immediately  if symptoms less severe.  You Must read complete instructions/literature along with all the possible adverse reactions/side effects for all the Medicines you take and that have been prescribed to you. Take any new Medicines after you have completely understood and accpet all the possible adverse reactions/side effects.

## 2021-04-02 NOTE — Progress Notes (Signed)
Physical Therapy Treatment Patient Details Name: Ricky Gonzalez MRN: [id number] DOB: 06-13-1971 Today's Date: 04/02/2021    History of Present Illness 50 y.o male presenting to ED with R sided numbness and weakness. 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    PT Comments    Patient progressing towards physical therapy goals. Patient ambulating 48' and 10' with modA+2 and RW. Required max cues to attend to R side, running into objects on R. Performed dynamic standing balance at sink with single UE support and reaching across body and outside of BOS. Extensive education on need for assistance for OOB mobility due to patient reporting getting himself to recliner by himself, patient and significant other verbalized understanding. Continue to recommend comprehensive inpatient rehab (CIR) for post-acute therapy needs.     Follow Up Recommendations  CIR     Equipment Recommendations  Other (comment) (TBD)    Recommendations for Other Services       Precautions / Restrictions Precautions Precautions: Fall Precaution Comments: ataxia Restrictions Weight Bearing Restrictions: No    Mobility  Bed Mobility               General bed mobility comments: Pt in recliner upon arrival    Transfers Overall transfer level: Needs assistance Equipment used: Rolling Aqueelah Cotrell (2 wheeled) Transfers: Sit to/from Stand Sit to Stand: Min assist;+2 physical assistance;+2 safety/equipment         General transfer comment: minA+2 to rise and steady. As session progressed, was able to stand with min guard but upon standing required minA to steady  Ambulation/Gait Ambulation/Gait assistance: Mod assist;+2 physical assistance;+2 safety/equipment Gait Distance (Feet): 30 Feet (x10') Assistive device: Rolling Dontaye Hur (2  wheeled) Gait Pattern/deviations: Step-to pattern;Decreased stride length;Ataxic Gait velocity: decreased   General Gait Details: varying BOS requiring modA+2 for balance and RW management. Improved coordination with use of vision but required cues for attending to R UE on RW   Stairs             Wheelchair Mobility    Modified Rankin (Stroke Patients Only) Modified Rankin (Stroke Patients Only) Pre-Morbid Rankin Score: Slight disability Modified Rankin: Moderately severe disability     Balance Overall balance assessment: Needs assistance Sitting-balance support: No upper extremity supported;Feet supported Sitting balance-Leahy Scale: Good Sitting balance - Comments: weight shifting to tie shoes in recliner and reach to R side to retrieve wash cloth, and reaching forward to put wash cloth in stack. Postural control: Posterior lean Standing balance support: Bilateral upper extremity supported;Single extremity supported;During functional activity Standing balance-Leahy Scale: Poor Standing balance comment: reliant on UE support and external assist                            Cognition Arousal/Alertness: Awake/alert Behavior During Therapy: WFL for tasks assessed/performed Overall Cognitive Status: Impaired/Different from baseline Area of Impairment: Attention;Memory;Safety/judgement;Following commands;Awareness;Problem solving                   Current Attention Level: Sustained Memory: Decreased short-term memory Following Commands: Follows one step commands consistently;Follows one step commands with increased time;Follows multi-step commands with increased time Safety/Judgement: Decreased awareness of safety;Decreased awareness of deficits Awareness: Intellectual;Emergent Problem Solving: Slow processing;Requires verbal cues;Difficulty sequencing General Comments: Pt demonstrating decreased safety awareness reporting he got himself up to the chair. Pt  and girlfriend educated on safety and assistance required with mobility at this time. Pt presenting with decreased memory, attention, and problem solving. Decreased attention to R side required max verbal cues to attend during mobility. Despite poor awareness, highly motivated to participate.      Exercises Other Exercises Other Exercises: Pt performing dynamic standing balance task at sink, reaching outside of BOS with RUE on L side and R side. Using RUE to cross midline focusing on natural movement patterns to retrieve wash cloth using pinch grasp on table to L side, and bringing back across midline reaching outside of BOS to drop into a container on R side. Supporting self on sink with LUE throughout task. x20 repititions. Min G-Min A during exercise; cues for safety, and normalized movement patterns. Pt requiring 1 seated rest break. Other Exercises: Pt retrieving wash cloth in container on R side and making stack on sink at midline while sitting in chair x20 repititions focusing on BUE coordination, fine motor, gross motor, balance, and attention to R side. Supervision for safety, cues for normalized movement patterns    General Comments General comments (skin integrity, edema, etc.): "lady friend" present. Pt and friend reporting he got to chair by himself. Pt educated this is unsafe and that he should have assistance of two helpers with him at when he gets up. Verbalizing understanding      Pertinent Vitals/Pain Pain Assessment: No/denies pain    Home Living                      Prior Function            PT Goals (current goals can now be found in the care plan section) Acute Rehab PT Goals Patient Stated Goal: get better PT Goal Formulation: With patient Time For Goal Achievement: 04/13/21 Potential to Achieve Goals: Good Progress towards PT goals: Progressing toward goals    Frequency    Min 4X/week      PT Plan Current plan remains appropriate     Co-evaluation              AM-PAC PT "6 Clicks" Mobility   Outcome Measure  Help needed turning from your back to your side while in a flat bed without using bedrails?: A Little Help needed moving from lying on your back to sitting on the side of a flat bed without using bedrails?: A Little Help needed moving to and from a bed to a chair (including a wheelchair)?: Total Help needed standing up from a chair using your arms (e.g., wheelchair or bedside chair)?: Total Help needed to walk in hospital room?: Total Help needed climbing 3-5 steps with a railing? : Total 6 Click Score: 10    End of Session Equipment Utilized During Treatment: Gait belt Activity Tolerance: Patient tolerated treatment well Patient left: in chair;with call bell/phone within reach;with chair alarm set Nurse Communication: Mobility status PT Visit Diagnosis: Unsteadiness on feet (R26.81);Muscle weakness (generalized) (M62.81);Other abnormalities of gait and mobility (R26.89)     Time: 3532-9924 PT Time Calculation (min) (ACUTE ONLY): 44 min  Charges:  $Therapeutic Activity: 8-22 mins                     Monique Hefty A. Dan Humphreys PT, DPT Acute Rehabilitation Services Pager [phone] Office [phone]    Viviann Spare 04/02/2021, 4:04 PM

## 2021-04-02 NOTE — Progress Notes (Signed)
Occupational Therapy Treatment Patient Details Name: Ricky Gonzalez MRN: [id number] DOB: 10/05/1970 Today's Date: 04/02/2021    History of present illness 50 y.o male presenting to ED with R sided numbness and weakness. 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   OT comments  Pt progressing towards established OT goals. Pt requiring min guard for donning and tying shoes in recliner with back supported. Required mod A+2 with RW for functional mobility. Focus session on challenging pt UE coordination, balance, and targeted grasp and release through simulated home management task. Requiring Min guard-min A during dynamic reaching task in standing. Pt continues to present with decreased strength, activity tolerance fine motor, BUE coordination, attention, safety awareness, and memory. Due to pt's continued motivation, age, and support, recommend discharge at [facility] for intensive OT and will continue to follow acutely to optimize independence in ADLs and IADLs.    Follow Up Recommendations  CIR    Equipment Recommendations  None recommended by OT    Recommendations for Other Services Rehab consult    Precautions / Restrictions Precautions Precautions: Fall Precaution Comments: ataxia Restrictions Weight Bearing Restrictions: No       Mobility Bed Mobility               General bed mobility comments: Pt in recliner upon arrival    Transfers Overall transfer level: Needs assistance Equipment used: Rolling walker (2 wheeled) Transfers: Sit to/from Stand Sit to Stand: +2 physical assistance;+2 safety/equipment;Min guard         General transfer comment: minA+2 to rise and steady. Mod A+2 for functional mobility    Balance Overall balance assessment: Needs assistance Sitting-balance support: No upper  extremity supported;Feet supported Sitting balance-Leahy Scale: Good Sitting balance - Comments: weight shifting to tie shoes in recliner and reach to R side to retrieve wash cloth, and reaching forward to put wash cloth in stack. Postural control: Posterior lean Standing balance support: Bilateral upper extremity supported;Single extremity supported;During functional activity Standing balance-Leahy Scale: Poor Standing balance comment: reliant on UE support and external assist                           ADL either performed or assessed with clinical judgement   ADL Overall ADL's : Needs assistance/impaired                     Lower Body Dressing: Set up;Min guard;Sitting/lateral leans Lower Body Dressing Details (indicate cue type and reason): Pt donning shoes and tying shoes with min guard sitting in recliner. Pt with decreased dexterity while tying shoes. Toilet Transfer: Moderate assistance;+2 for safety/equipment;+2 for physical assistance;Ambulation;Regular Toilet           Functional mobility during ADLs: Moderate assistance;+2 for physical assistance;+2 for safety/equipment;Rolling walker General ADL Comments: Pt donning shoes and tying shoes supporting trunk on recliner with Min guard. Pt performing functional mobility with mod A +2 into hallway. Pt perfroming dynamic standing balance and sitting balance activities in preparation for IADL participation, see other exercises below     Vision   Additional Comments: decreased visual attention to R side, indicated by pt requiring cues to attend to objects on R side.   Perception     Praxis      Cognition Arousal/Alertness: Awake/alert Behavior During Therapy: WFL for tasks assessed/performed  Overall Cognitive Status: Impaired/Different from baseline Area of Impairment: Attention;Memory;Safety/judgement;Following commands;Awareness;Problem solving                   Current Attention Level:  Sustained Memory: Decreased short-term memory Following Commands: Follows one step commands consistently;Follows one step commands with increased time;Follows multi-step commands with increased time Safety/Judgement: Decreased awareness of safety;Decreased awareness of deficits Awareness: Intellectual;Emergent Problem Solving: Slow processing;Requires verbal cues;Difficulty sequencing General Comments: Pt demonstrating decreased safety awareness reporting he got himself up to the chair. Pt and girlfriend educated on safety and assistance required with mobility at this time. Pt presenting with decreased memory, attention, and problem solving. Decreased attention to R side required max verbal cues to attend during mobility. Despite poor awareness, highly motivated to participate.        Exercises Exercises: Other exercises Other Exercises Other Exercises: Pt performing dynamic standing balance task at sink, reaching outside of BOS with RUE on L side and R side. Using RUE to cross midline focusing on natural movement patterns to retrieve wash cloth using pinch grasp on table to L side, and bringing back across midline reaching outside of BOS to drop into a container on R side. Supporting self on sink with LUE throughout task. x20 repititions. Min G-Min A during exercise; cues for safety, and normalized movement patterns. Pt requiring 1 seated rest break. Other Exercises: Pt retrieving wash cloth in container on R side and making stack on sink at midline while sitting in chair x20 repititions focusing on BUE coordination, fine motor, gross motor, balance, and attention to R side. Supervision for safety, cues for normalized movement patterns   Shoulder Instructions       General Comments "lady friend" present. Pt and friend reporting he got to chair by himself. Pt educated this is unsafe and that he should have assistance of two helpers with him at when he gets up. Verbalizing understanding     Pertinent Vitals/ Pain          Home Living                                          Prior Functioning/Environment              Frequency  Min 2X/week        Progress Toward Goals  OT Goals(current goals can now be found in the care plan section)  Progress towards OT goals: Progressing toward goals  Acute Rehab OT Goals Patient Stated Goal: get better OT Goal Formulation: With patient Time For Goal Achievement: 04/13/21 Potential to Achieve Goals: Good ADL Goals Pt Will Perform Upper Body Dressing: with modified independence;sitting Pt Will Perform Lower Body Dressing: sit to/from stand;with min assist Pt Will Transfer to Toilet: ambulating;regular height toilet;with min assist Pt Will Perform Toileting - Clothing Manipulation and hygiene: with min guard assist;sit to/from stand  Plan Discharge plan remains appropriate    Co-evaluation                 AM-PAC OT "6 Clicks" Daily Activity     Outcome Measure   Help from another person eating meals?: A Little Help from another person taking care of personal grooming?: A Little Help from another person toileting, which includes using toliet, bedpan, or urinal?: A Lot Help from another person bathing (including washing, rinsing, drying)?: A Lot Help from another person to put on  and taking off regular upper body clothing?: A Little Help from another person to put on and taking off regular lower body clothing?: A Lot 6 Click Score: 15    End of Session Equipment Utilized During Treatment: Gait belt;Rolling walker  OT Visit Diagnosis: Unsteadiness on feet (R26.81);Muscle weakness (generalized) (M62.81);Ataxia, unspecified (R27.0);Dizziness and giddiness (R42);Hemiplegia and hemiparesis Hemiplegia - Right/Left: Right Hemiplegia - dominant/non-dominant: Dominant Hemiplegia - caused by: Nontraumatic intracerebral hemorrhage   Activity Tolerance Patient tolerated treatment well   Patient  Left in chair;with call bell/phone within reach;with chair alarm set;with family/visitor present   Nurse Communication Mobility status        Time: 8299-3716 OT Time Calculation (min): 46 min  Charges: OT General Charges $OT Visit: 1 Visit OT Treatments $Self Care/Home Management : 8-22 mins $Therapeutic Activity: 8-22 mins  Ladene Artist, OTDS    Ladene Artist 04/02/2021, 12:50 PM

## 2021-04-02 NOTE — TOC Transition Note (Signed)
Transition of Care [facility]) - CM/SW Discharge Note   Patient Details  Name: Kendal Raffo MRN: [id number] Date of Birth: 12/08/1970  [facility] CM/SW Contact:  Kermit Balo, RN Phone Number: 04/02/2021, 2:56 PM   Clinical Narrative:    Patient is discharging to [facility] inpatient rehab. Wife is going to provide transportation.  Bedside RN updated.  Number for report: [phone]   Final next level of care: IP Rehab Facility Barriers to Discharge: No Barriers Identified   Patient Goals and CMS Choice   CMS Medicare.gov Compare Post Acute Care list provided to:: Patient Choice offered to / list presented to : Patient, Spouse  Discharge Placement                       Discharge Plan and Services   Discharge Planning Services: CM Consult Post Acute Care Choice: IP Rehab                               Social Determinants of Health (SDOH) Interventions     Readmission Risk Interventions No flowsheet data found.

## 2021-04-02 NOTE — Discharge Summary (Signed)
Ricky Gonzalez ZOX:[id number] DOB: 04/02/1971 DOA: 03/29/2021  PCP: Pcp, No  Admit date: 03/29/2021  Discharge date: 04/02/2021  Admitted From: Home  Disposition:  CIR   Recommendations for Outpatient Follow-up:   Follow up with PCP in 1-2 weeks  PCP Please obtain BMP/CBC, 2 view CXR in 1week,  (see Discharge instructions)   PCP Please follow up on the following pending results:    Home Health: None   Equipment/Devices: None  Consultations: Neuro Discharge Condition: Stable    CODE STATUS: Full    Diet Recommendation: Heart Healthy   Diet Order             Diet - low sodium heart healthy           Diet Heart Room service appropriate? Yes; Fluid consistency: Thin  Diet effective now                    Chief Complaint  Patient presents with   Numbness     Brief history of present illness from the day of admission and additional interim summary    Patient is a 50 y.o. 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   Significant events: 6/27>> admit to neuro ICU for left thalamic intraparenchymal hemorrhage. 6/28>> transfer to [facility]   Significant studies: 6/27>> CT head: 4.5 mm acute intraparenchymal left thalamocapsular intraparenchymal hemorrhage 6/27>> CTA head/neck: No LVO, no high-grade stenosis, no aneurysm or vascular abnormality. 6/27>> MRI brain: Acute parenchymal hemorrhage within the left thalamocapsular junction and corona radiata 6/27>> TTE: EF 65-70%, grade 1 diastolic dysfunction. 6/27>> UDS: Negative 6/27>> A1c: 4.7 6/27>> LDL: 77   Antimicrobial therapy: None   Microbiology data: 6/27>> influenza/COVID PCR: Negative                                                                  Hospital Course    Left thalamic intraparenchymal hemorrhage: Suspected hypertensive etiology-no aneurysm/vascular abnormality seen on CT angiogram.  Minimal R. arm weakness-appreciate PT/OT input-CIR plan on discharge.   HTN: Noncompliant-currently BP stable with amlodipine, labetalol, counseled, BP better.   Alcohol withdrawal: Counseled to quit alcohol, no signs of DTs.  On folic acid and thiamine.   Tobacco use: Counseled to quit.    Discharge di  Has been counseled extensively to quit.agnosis     Active Problems:   Stroke (cerebrum) (HCC)   Primary hypertension   Thalamic hemorrhage (HCC)   Hypertensive emergency   Smoker   Alcohol abuse   History of stroke    Discharge instructions    Discharge Instructions     Ambulatory referral to Neurology   Complete by: As directed    Follow up with stroke  clinic NP (Jessica Vanschaick or Darrol Angel, if both not available, consider Manson Allan, or Ahern) at [facility] in about 4 weeks. Thanks.   Diet - low sodium heart healthy   Complete by: As directed    Discharge instructions   Complete by: As directed    Follow with Primary MD Pcp, No in 7 days   Get CBC, CMP, 2 view Chest X ray -  checked next visit within 1 week by CIR MD or PCP  Activity: As tolerated with Full fall precautions use walker/cane & assistance as needed  Disposition CIR  Diet: Heart Healthy    Special Instructions: If you have smoked or chewed Tobacco  in the last 2 yrs please stop smoking, stop any regular Alcohol  and or any Recreational drug use.  On your next visit with your primary care physician please Get Medicines reviewed and adjusted.  Please request your Prim.MD to go over all Hospital Tests and Procedure/Radiological results at the follow up, please get all Hospital records sent to your Prim MD by signing hospital release before you go home.  If you experience worsening of your admission symptoms,  develop shortness of breath, life threatening emergency, suicidal or homicidal thoughts you must seek medical attention immediately by calling 911 or calling your MD immediately  if symptoms less severe.  You Must read complete instructions/literature along with all the possible adverse reactions/side effects for all the Medicines you take and that have been prescribed to you. Take any new Medicines after you have completely understood and accpet all the possible adverse reactions/side effects.   Increase activity slowly   Complete by: As directed        Discharge Medications   Allergies as of 04/02/2021       Reactions   Fish Allergy Anaphylaxis   "All seafood"   Lisinopril Swelling        Medication List     STOP taking these medications    naproxen sodium 220 MG tablet Commonly known as: ALEVE       TAKE these medications    amLODipine 10 MG tablet Commonly known as: NORVASC Take 1 tablet (10 mg total) by mouth daily. Start taking on: April 03, 2021   atorvastatin 20 MG tablet Commonly known as: LIPITOR Take 1 tablet (20 mg total) by mouth daily. Start taking on: April 03, 2021   folic acid 1 MG tablet Commonly known as: FOLVITE Take 1 tablet (1 mg total) by mouth daily. Start taking on: April 03, 2021   gabapentin 400 MG capsule Commonly known as: NEURONTIN Take 400 mg by mouth at bedtime as needed (nerve pain).   ICY HOT BACK EX Apply 1 application topically as needed (pain). Icy hot gel   labetalol 200 MG tablet Commonly known as: NORMODYNE Take 1 tablet (200 mg total) by mouth 2 (two) times daily.   multivitamin with minerals Tabs tablet Take 1 tablet by mouth daily.   pantoprazole 40 MG tablet Commonly known as: PROTONIX Take 1 tablet (40 mg total) by mouth daily. Start taking on: April 03, 2021   polyvinyl alcohol 1.4 % ophthalmic solution Commonly known as: LIQUIFILM TEARS Place 1 drop into both eyes as needed for dry eyes.   thiamine 100 MG  tablet Take 1 tablet (100 mg total) by mouth daily. Start taking on: April 03, 2021         Follow-up Information     [facility]. Schedule an appointment as soon as possible  for a visit in 1 month(s).   Specialty: Neurology Why: stroke clinic Contact information: [address] [place] [address] [phone]                Major procedures and Radiology Reports - PLEASE review detailed and final reports thoroughly  -       CT Angio Head W or Wo Contrast  Result Date: 03/29/2021 CLINICAL DATA:  Initial evaluation for neuro deficit, stroke. EXAM: CT ANGIOGRAPHY HEAD AND NECK TECHNIQUE: Multidetector CT imaging of the head and neck was performed using the standard protocol during bolus administration of intravenous contrast. Multiplanar CT image reconstructions and MIPs were obtained to evaluate the vascular anatomy. Carotid stenosis measurements (when applicable) are obtained utilizing NASCET criteria, using the distal internal carotid diameter as the denominator. CONTRAST:  OMNIPAQUE IOHEXOL 350 MG/ML SOLN COMPARISON:  Prior CT from earlier the same day. FINDINGS: CTA NECK FINDINGS Aortic arch: Visualized aortic arch normal caliber with normal branch pattern. No stenosis seen about the origin the great vessels. Right carotid system: Right common and internal carotid arteries widely patent without stenosis, dissection or occlusion. Left carotid system: Left common and internal carotid arteries widely patent without stenosis, dissection or occlusion. Vertebral arteries: Both vertebral arteries arise from the subclavian arteries. No proximal subclavian artery stenosis. Right vertebral artery dominant. Atheromatous plaque at the origin of the left vertebral artery with mild stenosis. Vertebral arteries otherwise patent within the neck without stenosis dissection or occlusion. Skeleton: Prior fusion at C3-C5. No discrete or worrisome osseous  lesions. Other neck: No other acute soft tissue abnormality within the neck. No mass or adenopathy. 4 mm left thyroid nodule, of doubtful significance given size and patient age, no follow-up imaging recommended (ref: J Am Coll Radiol. 2015 Feb;12(2): 143-50). Upper chest: Visualized upper chest demonstrates no acute finding. Review of the MIP images confirms the above findings CTA HEAD FINDINGS Anterior circulation: Both internal carotid arteries widely patent to the termini without stenosis. A1 segments widely patent. Normal anterior communicating artery complex. Both anterior cerebral arteries widely patent to their distal aspects without stenosis. No M1 stenosis or occlusion. Normal MCA bifurcations. Distal MCA branches well perfused and symmetric. Posterior circulation: Both V4 segments patent to the vertebrobasilar junction without stenosis. Both PICA origins patent and normal. Basilar patent to its distal aspect without stenosis. Superior cerebellar arteries patent bilaterally. Fetal type origin left PCA. Hypoplastic right P1 and robust right posterior communicating artery supplies the right PCA. Both PCAs well perfused or distal aspects. Venous sinuses: Patent allowing for timing the contrast bolus. Anatomic variants: Predominant fetal type origin of the PCAs. No aneurysm. No vascular abnormality seen underlying the left cerebral hemorrhage. Review of the MIP images confirms the above findings IMPRESSION: Negative CTA of the head and neck. No large vessel occlusion. No high-grade or correctable stenosis identified. No aneurysm or vascular abnormality seen underlying the left cerebral hemorrhage. Electronically Signed   By: Rise Mu M.D.   On: 03/29/2021 02:46   CT Angio Neck W and/or Wo Contrast  Result Date: 03/29/2021 CLINICAL DATA:  Initial evaluation for neuro deficit, stroke. EXAM: CT ANGIOGRAPHY HEAD AND NECK TECHNIQUE: Multidetector CT imaging of the head and neck was performed using  the standard protocol during bolus administration of intravenous contrast. Multiplanar CT image reconstructions and MIPs were obtained to evaluate the vascular anatomy. Carotid stenosis measurements (when applicable) are obtained utilizing NASCET criteria, using the distal internal carotid diameter as the denominator. CONTRAST:  OMNIPAQUE IOHEXOL 350 MG/ML SOLN COMPARISON:  Prior CT from earlier the same day. FINDINGS: CTA NECK FINDINGS Aortic arch: Visualized aortic arch normal caliber with normal branch pattern. No stenosis seen about the origin the great vessels. Right carotid system: Right common and internal carotid arteries widely patent without stenosis, dissection or occlusion. Left carotid system: Left common and internal carotid arteries widely patent without stenosis, dissection or occlusion. Vertebral arteries: Both vertebral arteries arise from the subclavian arteries. No proximal subclavian artery stenosis. Right vertebral artery dominant. Atheromatous plaque at the origin of the left vertebral artery with mild stenosis. Vertebral arteries otherwise patent within the neck without stenosis dissection or occlusion. Skeleton: Prior fusion at C3-C5. No discrete or worrisome osseous lesions. Other neck: No other acute soft tissue abnormality within the neck. No mass or adenopathy. 4 mm left thyroid nodule, of doubtful significance given size and patient age, no follow-up imaging recommended (ref: J Am Coll Radiol. 2015 Feb;12(2): 143-50). Upper chest: Visualized upper chest demonstrates no acute finding. Review of the MIP images confirms the above findings CTA HEAD FINDINGS Anterior circulation: Both internal carotid arteries widely patent to the termini without stenosis. A1 segments widely patent. Normal anterior communicating artery complex. Both anterior cerebral arteries widely patent to their distal aspects without stenosis. No M1 stenosis or occlusion. Normal MCA bifurcations. Distal MCA  branches well perfused and symmetric. Posterior circulation: Both V4 segments patent to the vertebrobasilar junction without stenosis. Both PICA origins patent and normal. Basilar patent to its distal aspect without stenosis. Superior cerebellar arteries patent bilaterally. Fetal type origin left PCA. Hypoplastic right P1 and robust right posterior communicating artery supplies the right PCA. Both PCAs well perfused or distal aspects. Venous sinuses: Patent allowing for timing the contrast bolus. Anatomic variants: Predominant fetal type origin of the PCAs. No aneurysm. No vascular abnormality seen underlying the left cerebral hemorrhage. Review of the MIP images confirms the above findings IMPRESSION: Negative CTA of the head and neck. No large vessel occlusion. No high-grade or correctable stenosis identified. No aneurysm or vascular abnormality seen underlying the left cerebral hemorrhage. Electronically Signed   By: Rise Mu M.D.   On: 03/29/2021 02:46   MR BRAIN W WO CONTRAST  Result Date: 03/29/2021 CLINICAL DATA:  Cerebral hemorrhage suspected. EXAM: MRI HEAD WITHOUT AND WITH CONTRAST TECHNIQUE: Multiplanar, multiecho pulse sequences of the brain and surrounding structures were obtained without and with intravenous contrast. CONTRAST:  6.60mL GADAVIST GADOBUTROL 1 MMOL/ML IV SOLN COMPARISON:  Noncontrast head CT and CT angiogram head/neck 03/29/2021. FINDINGS: Brain: Mild intermittent motion degradation. Mild generalized cerebral and cerebellar atrophy. A 2.1 x 1.5 x 2.8 cm acute parenchymal hemorrhage centered within the left thalamocapsular junction and left corona radiata is unchanged in size as compared to the head CT performed earlier today. Mild surrounding edema. There is minimal ill-defined enhancement surrounding the hemorrhage. No masslike or nodular enhancement is demonstrated. Chronic lacunar infarct within the left lentiform nucleus (series 7, image 15). Elsewhere, minimal  multifocal T2/FLAIR hyperintensity within the cerebral white matter is nonspecific, but compatible with chronic small vessel ischemic disease. The hippocampi are symmetric in size and signal. Possible punctate chronic microhemorrhage within the anterior left temporal lobe (series 10, image 10). There is no acute infarct. No evidence of an intracranial mass. No extra-axial fluid collection. No midline shift. Vascular: Expected proximal arterial flow voids. Skull and upper cervical spine: No focal marrow lesion. Susceptibility artifact arising from spinal fusion hardware. Sinuses/Orbits: Visualized orbits show no acute finding.  Chronic medially displaced fracture deformity of the right lamina papyracea. Other: 12 mm right forehead lipoma. IMPRESSION: 2.1 x 1.5 x 2.8 cm acute parenchymal hemorrhage centered within the left thalamocapsular junction and corona radiata, unchanged in size from the head CT performed earlier today. Unchanged mild surrounding edema. Additionally, there is mild ill-defined enhancement surrounding the hemorrhage, but no masslike or nodular enhancement is demonstrated. Chronic left basal ganglia lacunar infarct. Mild cerebral white matter chronic small vessel ischemic disease. Mild generalized parenchymal atrophy. Electronically Signed   By: Jackey LogeKyle  Golden DO   On: 03/29/2021 11:41   ECHOCARDIOGRAM COMPLETE  Result Date: 03/29/2021    ECHOCARDIOGRAM REPORT   Patient Name:   Lyn HenriLANDIS Springer Date of Exam: 03/29/2021 Medical Rec #:  [id number]    Height:       69.0 in Accession #:    [id number]   Weight:       145.0 lb Date of Birth:  Oct 24, 1970   BSA:          1.802 m Patient Age:    49 years     BP:           130/87 mmHg Patient Gender: M            HR:           86 bpm. Exam Location:  Inpatient Procedure: 2D Echo, Cardiac Doppler and Color Doppler Indications:    Stroke  History:        Patient has no prior history of Echocardiogram examinations.                 Stroke; Risk Factors:Hypertension.   Sonographer:    Shirlean KellyJohn Mendel Brown Referring Phys: [phone] MCNEILL P KIRKPATRICK IMPRESSIONS  1. Left ventricular ejection fraction, by estimation, is 65 to 70%. The left ventricle has normal function. The left ventricle has no regional wall motion abnormalities. There is severe concentric left ventricular hypertrophy. Left ventricular diastolic  parameters are consistent with Grade I diastolic dysfunction (impaired relaxation).  2. Right ventricular systolic function is normal. The right ventricular size is normal. Tricuspid regurgitation signal is inadequate for assessing PA pressure.  3. The mitral valve is normal in structure. Trivial mitral valve regurgitation. No evidence of mitral stenosis.  4. The aortic valve is tricuspid. Aortic valve regurgitation is not visualized. No aortic stenosis is present.  5. The inferior vena cava is normal in size with greater than 50% respiratory variability, suggesting right atrial pressure of 3 mmHg. Comparison(s): No prior Echocardiogram. FINDINGS  Left Ventricle: Left ventricular ejection fraction, by estimation, is 65 to 70%. The left ventricle has normal function. The left ventricle has no regional wall motion abnormalities. The left ventricular internal cavity size was small. There is severe concentric left ventricular hypertrophy. Left ventricular diastolic parameters are consistent with Grade I diastolic dysfunction (impaired relaxation). Right Ventricle: The right ventricular size is normal. No increase in right ventricular wall thickness. Right ventricular systolic function is normal. Tricuspid regurgitation signal is inadequate for assessing PA pressure. Left Atrium: Left atrial size was normal in size. Right Atrium: Right atrial size was normal in size. Pericardium: There is no evidence of pericardial effusion. Mitral Valve: The mitral valve is normal in structure. Trivial mitral valve regurgitation. No evidence of mitral valve stenosis. Tricuspid Valve: The tricuspid  valve is normal in structure. Tricuspid valve regurgitation is not demonstrated. No evidence of tricuspid stenosis. Aortic Valve: The aortic valve is tricuspid. Aortic valve regurgitation is not visualized. No aortic stenosis  is present. Aortic valve mean gradient measures 5.0 mmHg. Aortic valve peak gradient measures 7.0 mmHg. Aortic valve area, by VTI measures 1.96 cm. Pulmonic Valve: The pulmonic valve was grossly normal. Pulmonic valve regurgitation is not visualized. No evidence of pulmonic stenosis. Aorta: The aortic root and ascending aorta are structurally normal, with no evidence of dilitation. Venous: The inferior vena cava is normal in size with greater than 50% respiratory variability, suggesting right atrial pressure of 3 mmHg. IAS/Shunts: The atrial septum is grossly normal.  LEFT VENTRICLE PLAX 2D LVIDd:         3.60 cm  Diastology LVIDs:         2.00 cm  LV e' medial:    7.62 cm/s LV PW:         1.40 cm  LV E/e' medial:  8.5 LV IVS:        1.50 cm  LV e' lateral:   8.92 cm/s LVOT diam:     2.10 cm  LV E/e' lateral: 7.3 LV SV:         53 LV SV Index:   29 LVOT Area:     3.46 cm  RIGHT VENTRICLE             IVC RV Basal diam:  3.40 cm     IVC diam: 1.00 cm RV S prime:     10.10 cm/s TAPSE (M-mode): 1.9 cm LEFT ATRIUM             Index       RIGHT ATRIUM           Index LA diam:        3.05 cm 1.69 cm/m  RA Area:     11.10 cm LA Vol (A2C):   49.7 ml 27.58 ml/m RA Volume:   24.70 ml  13.71 ml/m LA Vol (A4C):   34.9 ml 19.37 ml/m LA Biplane Vol: 45.4 ml 25.19 ml/m  AORTIC VALVE AV Area (Vmax):    2.21 cm AV Area (Vmean):   2.10 cm AV Area (VTI):     1.96 cm AV Vmax:           132.00 cm/s AV Vmean:          102.000 cm/s AV VTI:            0.268 m AV Peak Grad:      7.0 mmHg AV Mean Grad:      5.0 mmHg LVOT Vmax:         84.20 cm/s LVOT Vmean:        61.800 cm/s LVOT VTI:          0.152 m LVOT/AV VTI ratio: 0.57  AORTA Ao Root diam: 3.00 cm Ao Asc diam:  3.00 cm MITRAL VALVE MV Area (PHT): 3.57  cm    SHUNTS MV Decel Time: 213 msec    Systemic VTI:  0.15 m MV E velocity: 64.75 cm/s  Systemic Diam: 2.10 cm MV A velocity: 62.35 cm/s MV E/A ratio:  1.04 Riley Lam MD Electronically signed by Riley Lam MD Signature Date/Time: 03/29/2021/2:37:48 PM    Final    CT HEAD CODE STROKE WO CONTRAST  Result Date: 03/29/2021 CLINICAL DATA:  Code stroke. Initial evaluation for acute right-sided numbness and weakness. EXAM: CT HEAD WITHOUT CONTRAST TECHNIQUE: Contiguous axial images were obtained from the base of the skull through the vertex without intravenous contrast. COMPARISON:  None. FINDINGS: Brain: Acute intraparenchymal hemorrhage centered at the left thalamus/internal capsule  measures 2.1 x 1.5 x 2.8 cm (estimated volume 4.5 cc). Mild surrounding edema without significant regional mass effect. No visible intraventricular extension. No other acute intracranial hemorrhage. No other acute large vessel territory infarct. No mass lesion or midline shift. No hydrocephalus or extra-axial fluid collection. Vascular: No hyperdense vessel. Scattered vascular calcifications noted within the carotid siphons. Skull: Small lipoma noted at the right frontal scalp. Calvarium intact. Sinuses/Orbits: Globes orbital soft tissues demonstrate no acute finding. Remote posttraumatic defect at the right lamina papyracea. Paranasal sinuses are clear. Small chronic right mastoid effusion noted. Other: None. ASPECTS [facility] Stroke Program Early CT Score) Does not apply, acute intracranial hemorrhage. IMPRESSION: 4.5 mL acute intraparenchymal left thalamocapsular intraparenchymal hemorrhage. Mild surrounding edema without significant regional mass effect. No intraventricular extension. A hypertensive etiology is suspected. Critical Value/emergent results were called by telephone at the time of interpretation on 03/29/2021 at 12:52 am to provider [facility] , who verbally acknowledged these results. Electronically  Signed   By: Rise Mu M.D.   On: 03/29/2021 00:54    Micro Results     Recent Results (from the past 240 hour(s))  Resp Panel by RT-PCR (Flu A&B, Covid) Nasopharyngeal Swab     Status: None   Collection Time: 03/29/21 12:30 AM   Specimen: Nasopharyngeal Swab; Nasopharyngeal(NP) swabs in vial transport medium  Result Value Ref Range Status   SARS Coronavirus 2 by RT PCR NEGATIVE NEGATIVE Final    Comment: (NOTE) SARS-CoV-2 target nucleic acids are NOT DETECTED.  The SARS-CoV-2 RNA is generally detectable in upper respiratory specimens during the acute phase of infection. The lowest concentration of SARS-CoV-2 viral copies this assay can detect is 138 copies/mL. A negative result does not preclude SARS-Cov-2 infection and should not be used as the sole basis for treatment or other patient management decisions. A negative result may occur with  improper specimen collection/handling, submission of specimen other than nasopharyngeal swab, presence of viral mutation(s) within the areas targeted by this assay, and inadequate number of viral copies(<138 copies/mL). A negative result must be combined with clinical observations, patient history, and epidemiological information. The expected result is Negative.  Fact Sheet for Patients:  BloggerCourse.com  Fact Sheet for Healthcare Providers:  SeriousBroker.it  This test is no t yet approved or cleared by the [place] FDA and  has been authorized for detection and/or diagnosis of SARS-CoV-2 by FDA under an Emergency Use Authorization (EUA). This EUA will remain  in effect (meaning this test can be used) for the duration of the COVID-19 declaration under Section 564(b)(1) of the Act, 21 U.S.C.section 360bbb-3(b)(1), unless the authorization is terminated  or revoked sooner.       Influenza A by PCR NEGATIVE NEGATIVE Final   Influenza B by PCR NEGATIVE NEGATIVE Final     Comment: (NOTE) The Xpert Xpress SARS-CoV-2/FLU/RSV plus assay is intended as an aid in the diagnosis of influenza from Nasopharyngeal swab specimens and should not be used as a sole basis for treatment. Nasal washings and aspirates are unacceptable for Xpert Xpress SARS-CoV-2/FLU/RSV testing.  Fact Sheet for Patients: BloggerCourse.com  Fact Sheet for Healthcare Providers: SeriousBroker.it  This test is not yet approved or cleared by the [place] FDA and has been authorized for detection and/or diagnosis of SARS-CoV-2 by FDA under an Emergency Use Authorization (EUA). This EUA will remain in effect (meaning this test can be used) for the duration of the COVID-19 declaration under Section 564(b)(1) of the Act, 21 U.S.C. section 360bbb-3(b)(1), unless the  authorization is terminated or revoked.  Performed at [facility], [address], [place], [place] [address]   Surgical pcr screen     Status: None   Collection Time: 03/29/21  5:37 AM   Specimen: Nasal Mucosa; Nasal Swab  Result Value Ref Range Status   MRSA, PCR NEGATIVE NEGATIVE Final   Staphylococcus aureus NEGATIVE NEGATIVE Final    Comment: (NOTE) The Xpert SA Assay (FDA approved for NASAL specimens in patients 30 years of age and older), is one component of a comprehensive surveillance program. It is not intended to diagnose infection nor to guide or monitor treatment. Performed at [facility], [address], [place], [place] [address]     Today   Subjective    Ricci Dirocco today has no headache,no chest abdominal pain,no new weakness tingling or numbness, feels much better.   Objective   Blood pressure 140/85, pulse 62, temperature 98.4 F (36.9 C), temperature source Oral, resp. rate 17, height 5\' 9"  (1.753 m), weight 65.8 kg, SpO2 100 %.  No intake or output data in the 24 hours ending 04/02/21 1127  Exam  Awake Alert, No new  F.N deficits, mild R arm weakness [place].AT,PERRAL Supple Neck,No JVD, No cervical lymphadenopathy appriciated.  Symmetrical Chest wall movement, Good air movement bilaterally, CTAB RRR,No Gallops,Rubs or new Murmurs, No Parasternal Heave +ve B.Sounds, Abd Soft, Non tender, No organomegaly appriciated, No rebound -guarding or rigidity. No Cyanosis, Clubbing or edema, No new Rash or bruise   Data Review   CBC w Diff:  Lab Results  Component Value Date   WBC 5.8 03/29/2021   HGB 14.7 03/29/2021   HCT 43.0 03/29/2021   PLT 204 03/29/2021   LYMPHOPCT 63 03/29/2021   MONOPCT 10 03/29/2021   EOSPCT 1 03/29/2021   BASOPCT 1 03/29/2021    CMP:  Lab Results  Component Value Date   NA 133 (L) 04/01/2021   K 3.6 04/01/2021   CL 99 04/01/2021   CO2 24 04/01/2021   BUN 8 04/01/2021   CREATININE 1.09 04/01/2021   PROT 6.5 03/31/2021   ALBUMIN 3.8 03/31/2021   BILITOT 1.6 (H) 03/31/2021   ALKPHOS 53 03/31/2021   AST 39 03/31/2021   ALT 31 03/31/2021  . Lab Results  Component Value Date   HGBA1C 4.7 (L) 03/29/2021    Lab Results  Component Value Date   CHOL 248 (H) 03/29/2021   HDL 118 03/29/2021   LDLCALC 77 03/29/2021   TRIG 266 (H) 03/29/2021   CHOLHDL 2.1 03/29/2021     Total Time in preparing paper work, data evaluation and todays exam - 35 minutes  03/31/2021 M.D on 04/02/2021 at 11:27 AM  [facility]

## 2021-04-02 NOTE — Progress Notes (Signed)
Patient for transfer to [facility] facility, report given to [facility] RN Haydee Salter, AVS printed and discharge instructions given.

## 2021-04-02 NOTE — Plan of Care (Signed)
  Problem: Education: Goal: Knowledge of General Education information will improve Description: Including pain rating scale, medication(s)/side effects and non-pharmacologic comfort measures Outcome: Progressing   Problem: Health Behavior/Discharge Planning: Goal: Ability to manage health-related needs will improve Outcome: Progressing   Problem: Clinical Measurements: Goal: Ability to maintain clinical measurements within normal limits will improve Outcome: Progressing Goal: Will remain free from infection Outcome: Progressing Goal: Diagnostic test results will improve Outcome: Progressing Goal: Respiratory complications will improve Outcome: Progressing Goal: Cardiovascular complication will be avoided Outcome: Progressing   Problem: Activity: Goal: Risk for activity intolerance will decrease Outcome: Progressing   Problem: Nutrition: Goal: Adequate nutrition will be maintained Outcome: Progressing   Problem: Coping: Goal: Level of anxiety will decrease Outcome: Progressing   Problem: Elimination: Goal: Will not experience complications related to bowel motility Outcome: Progressing Goal: Will not experience complications related to urinary retention Outcome: Progressing   Problem: Pain Managment: Goal: General experience of comfort will improve Outcome: Progressing   Problem: Safety: Goal: Ability to remain free from injury will improve Outcome: Progressing   Problem: Skin Integrity: Goal: Risk for impaired skin integrity will decrease Outcome: Progressing   Problem: Education: Goal: Knowledge of disease or condition will improve Outcome: Progressing Goal: Knowledge of secondary prevention will improve Outcome: Progressing Goal: Knowledge of patient specific risk factors addressed and post discharge goals established will improve Outcome: Progressing   Problem: Coping: Goal: Will verbalize positive feelings about self Outcome: Progressing   Problem:  Self-Care: Goal: Ability to participate in self-care as condition permits will improve Outcome: Progressing   Problem: Intracerebral Hemorrhage Tissue Perfusion: Goal: Complications of Intracerebral Hemorrhage will be minimized Outcome: Progressing   

## 2022-01-04 IMAGING — CT CT HEAD CODE STROKE
3 series · 15 of 47 positions shown, 18 images · non-contrast
Comparison: None.

CLINICAL DATA: Code stroke. Initial evaluation for acute
right-sided numbness and weakness.

EXAM:
CT HEAD WITHOUT CONTRAST
TECHNIQUE: Contiguous axial images were obtained from the base of the skull
through the vertex without intravenous contrast.

[Series 2: head wo · axial · 0.45mm/px · z∈[+954,+1108]mm · 9 of 37 slices shown, 12 images]
[im 3/37  brain]
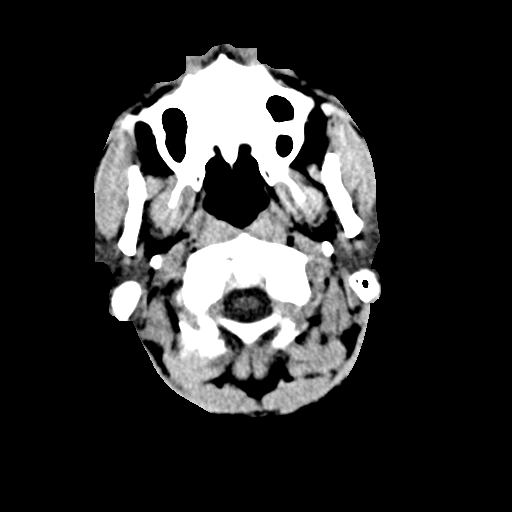
[im 3/37  bone]
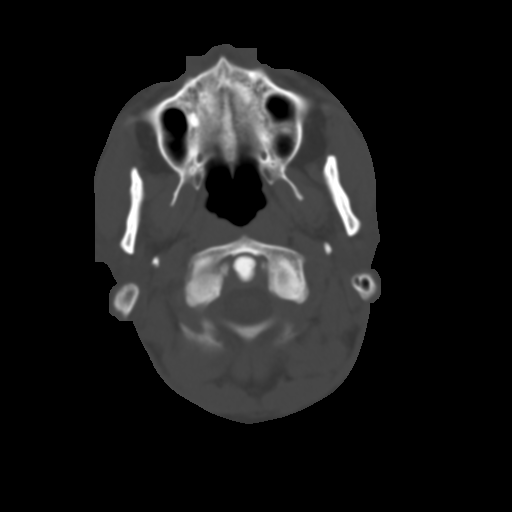
[im 7/37  brain]
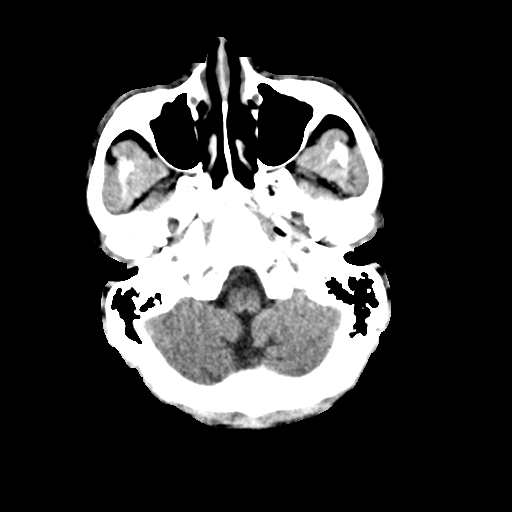
[im 10/37  brain]
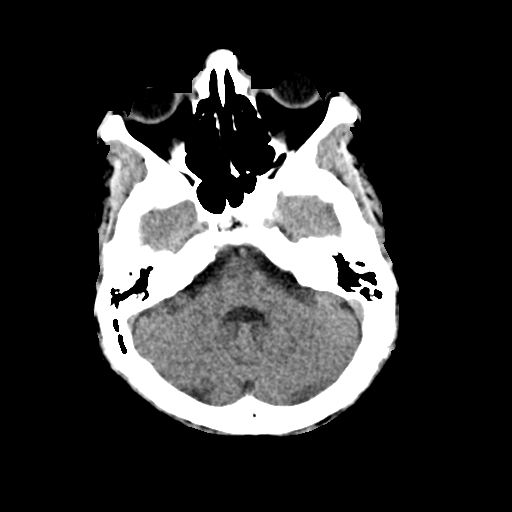
[im 14/37  brain]
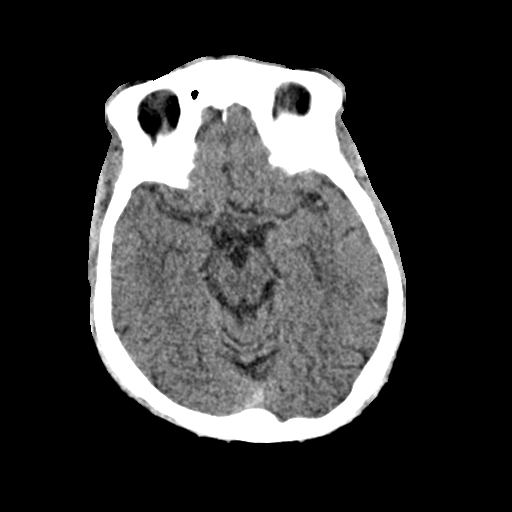
[im 19/37  brain]
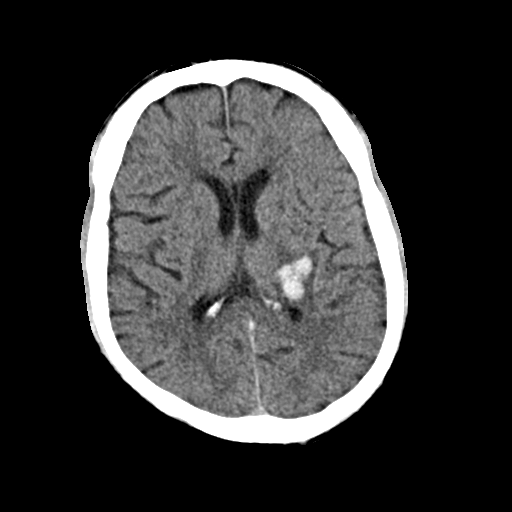
[im 19/37  bone]
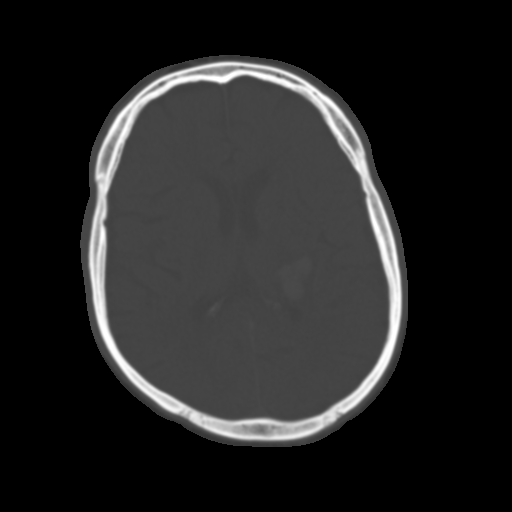
[im 23/37  brain]
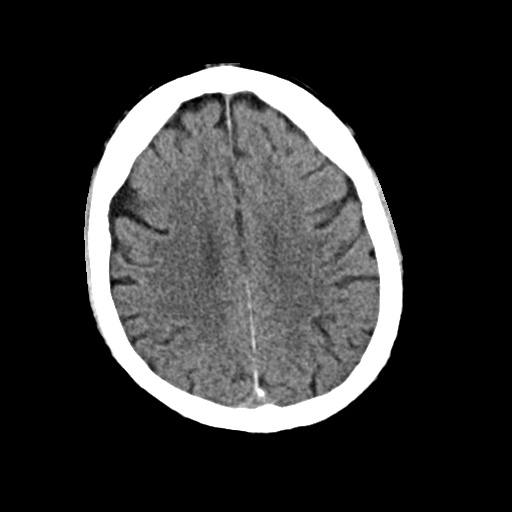
[im 27/37  brain]
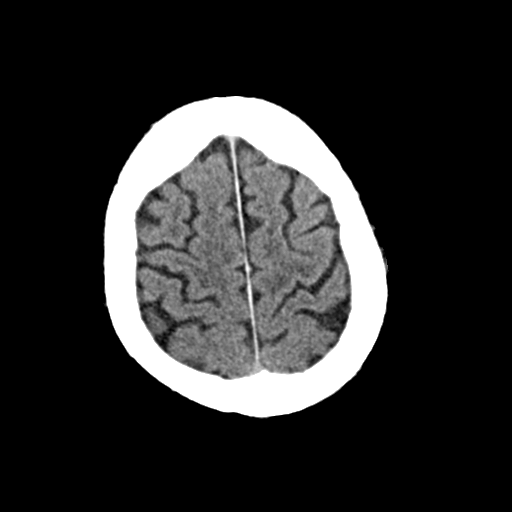
[im 30/37  brain]
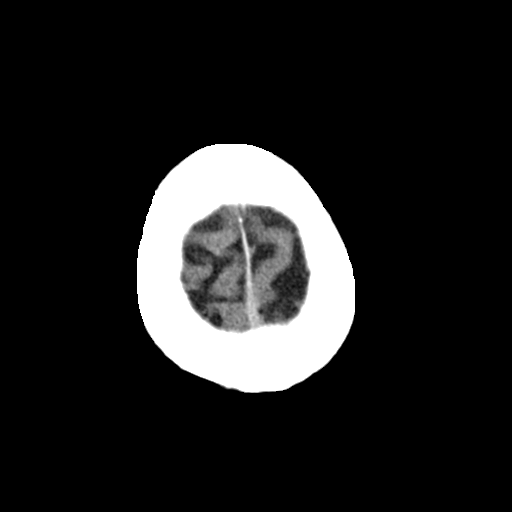
[im 34/37  brain]
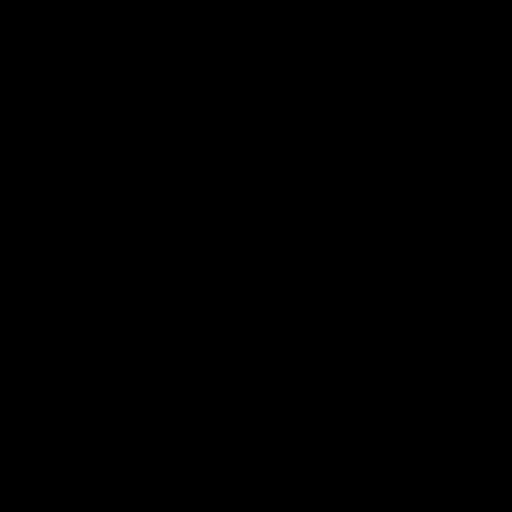
[im 34/37  bone]
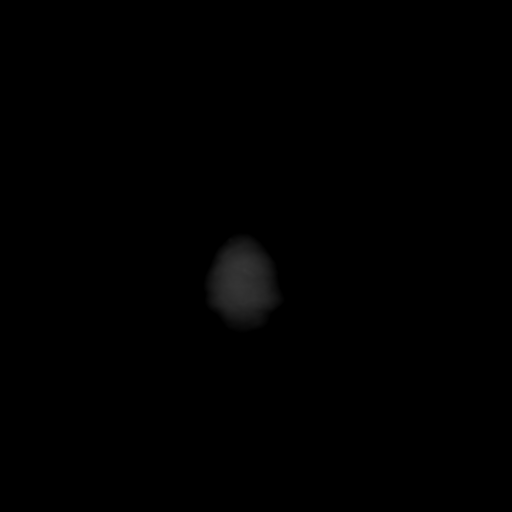

[Series 4: coronal soft · coronal · 0.35mm/px · 3 of 67 slices shown]
[im 23/67  brain]
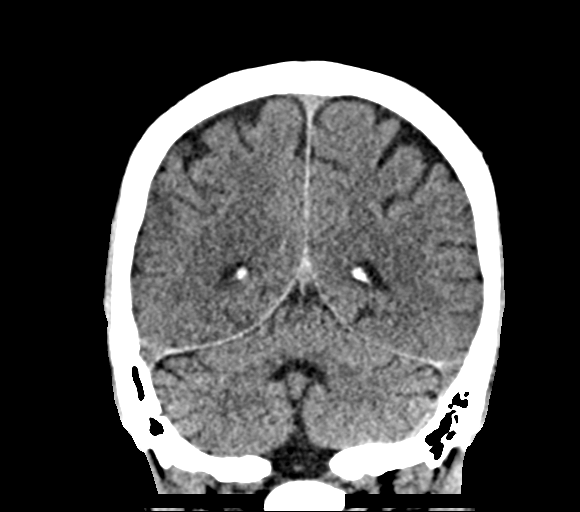
[im 30/67  brain]
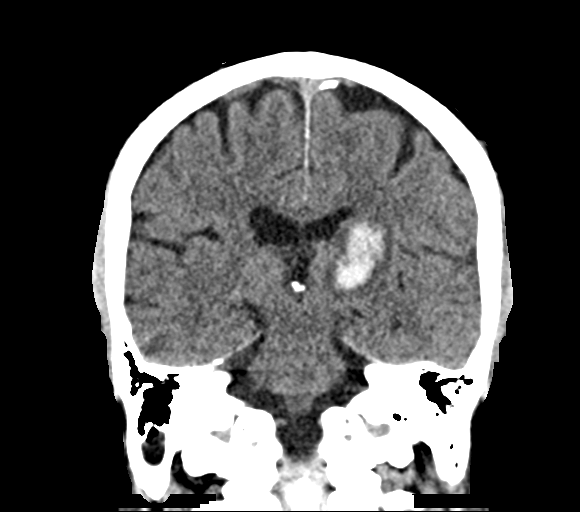
[im 37/67  brain]
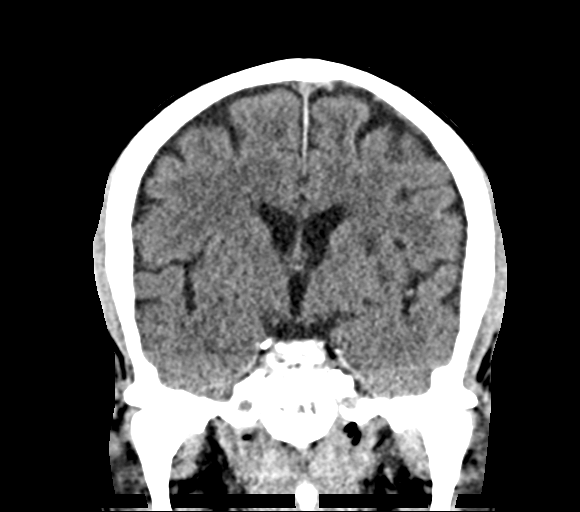

[Series 5: sag soft · sagittal · 0.35mm/px · 3 of 64 slices shown]
[im 22/64  brain]
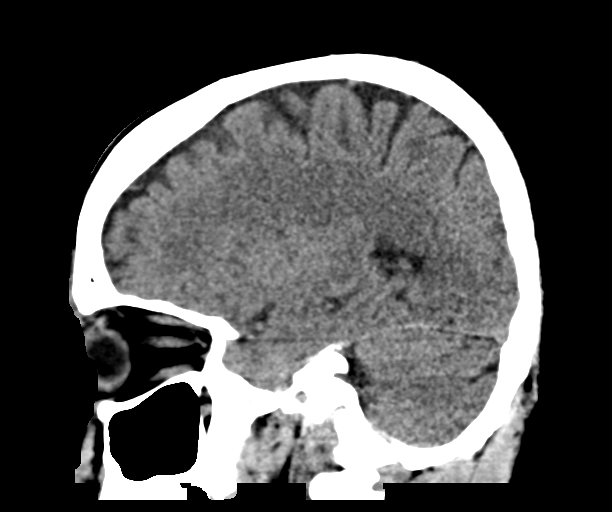
[im 32/64  brain]
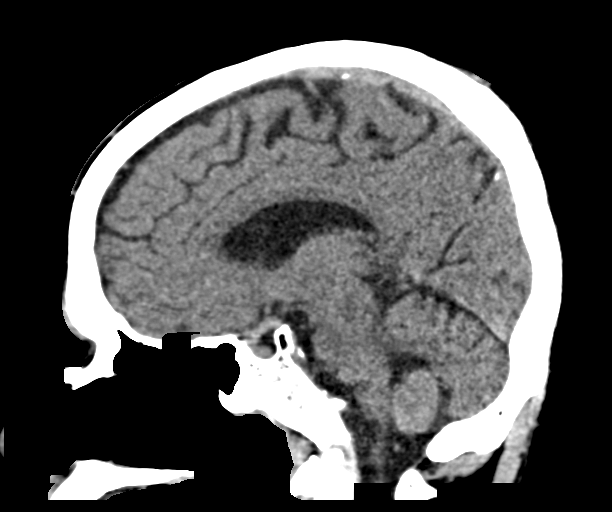
[im 43/64  brain]
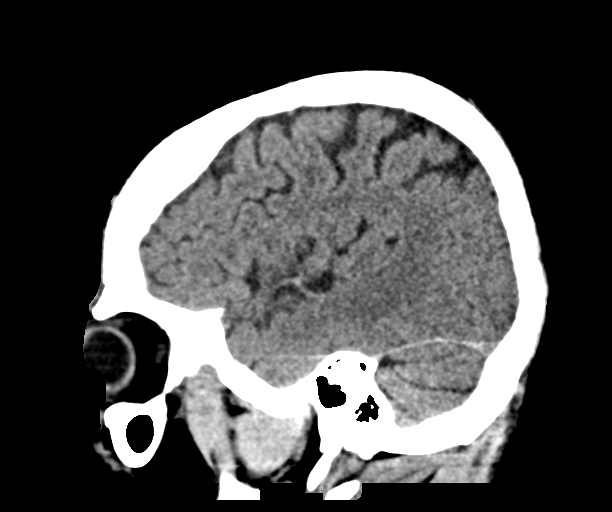

[15 of 47 positions shown; findings below may reference images not displayed]

FINDINGS: Brain: Acute intraparenchymal hemorrhage centered at the left
thalamus/internal capsule measures 2.1 x 1.5 x 2.8 cm (estimated
volume 4.5 cc). Mild surrounding edema without significant regional
mass effect. No visible intraventricular extension. No other acute
intracranial hemorrhage. No other acute large vessel territory
infarct. No mass lesion or midline shift. No hydrocephalus or
extra-axial fluid collection.

Vascular: No hyperdense vessel. Scattered vascular calcifications
noted within the carotid siphons.

Skull: Small lipoma noted at the right frontal scalp. Calvarium
intact.

Sinuses/Orbits: Globes orbital soft tissues demonstrate no acute
finding. Remote posttraumatic defect at the right lamina papyracea.
Paranasal sinuses are clear. Small chronic right mastoid effusion
noted.

Other: None.

ASPECTS ([place] Stroke Program Early CT Score)

Does not apply, acute intracranial hemorrhage.
IMPRESSION: 4.5 mL acute intraparenchymal left thalamocapsular intraparenchymal
hemorrhage. Mild surrounding edema without significant regional mass
effect. No intraventricular extension. A hypertensive etiology is
suspected.

Critical Value/emergent results were called by telephone at the time
of interpretation on 03/29/2021 at [DATE] to provider MISCDOMOC FORKO
, who verbally acknowledged these results.
